# Patient Record
Sex: Female | Born: 1980 | Race: Black or African American | Hispanic: No | Marital: Single | Smoking: Never smoker
Health system: Southern US, Community
[De-identification: ages and names within clinical notes are randomized; demographics above are authoritative.]

## PROBLEM LIST (undated history)

## (undated) DIAGNOSIS — E079 Disorder of thyroid, unspecified: Secondary | ICD-10-CM

## (undated) DIAGNOSIS — E119 Type 2 diabetes mellitus without complications: Secondary | ICD-10-CM

## (undated) HISTORY — DX: Type 2 diabetes mellitus without complications: E11.9

## (undated) HISTORY — PX: TUBAL LIGATION: SHX77

---

## 2010-06-29 ENCOUNTER — Emergency Department (HOSPITAL_COMMUNITY): Admission: EM | Admit: 2010-06-29 | Discharge: 2010-06-29 | Payer: Self-pay | Admitting: Emergency Medicine

## 2011-02-26 LAB — POCT I-STAT, CHEM 8
BUN: 6 mg/dL (ref 6–23)
Calcium, Ion: 1.19 mmol/L (ref 1.12–1.32)
Creatinine, Ser: 0.4 mg/dL (ref 0.4–1.2)
Glucose, Bld: 123 mg/dL — ABNORMAL HIGH (ref 70–99)
HCT: 38 % (ref 36.0–46.0)
Hemoglobin: 12.9 g/dL (ref 12.0–15.0)
Sodium: 141 mEq/L (ref 135–145)

## 2011-02-26 LAB — POCT CARDIAC MARKERS
CKMB, poc: <1 ng/mL — ABNORMAL LOW (ref 1.0–8.0)
Myoglobin, poc: 23.6 ng/mL (ref 12–200)
Troponin i, poc: <0.05 ng/mL (ref 0.00–0.09)
Troponin i, poc: <0.05 ng/mL (ref 0.00–0.09)

## 2011-02-26 LAB — CBC
MCH: 26.4 pg (ref 26.0–34.0)
MCHC: 32.7 g/dL (ref 30.0–36.0)
MCV: 80.8 fL (ref 78.0–100.0)
Platelets: 202 10*3/uL (ref 150–400)

## 2011-02-26 LAB — TSH: TSH: <0.004 u[IU]/mL — ABNORMAL LOW (ref 0.350–4.500)

## 2011-02-26 LAB — DIFFERENTIAL
Basophils Relative: 1 % (ref 0–1)
Lymphs Abs: 2.8 10*3/uL (ref 0.7–4.0)
Monocytes Relative: 13 % — ABNORMAL HIGH (ref 3–12)
Neutro Abs: 2.7 10*3/uL (ref 1.7–7.7)

## 2011-09-03 ENCOUNTER — Emergency Department (INDEPENDENT_AMBULATORY_CARE_PROVIDER_SITE_OTHER): Payer: BC Managed Care – PPO

## 2011-09-03 ENCOUNTER — Other Ambulatory Visit: Payer: Self-pay

## 2011-09-03 ENCOUNTER — Emergency Department (HOSPITAL_BASED_OUTPATIENT_CLINIC_OR_DEPARTMENT_OTHER)
Admission: EM | Admit: 2011-09-03 | Discharge: 2011-09-03 | Disposition: A | Discharge status: Home / Self Care | Payer: BC Managed Care – PPO | Attending: Emergency Medicine | Admitting: Emergency Medicine

## 2011-09-03 ENCOUNTER — Encounter: Payer: Self-pay | Admitting: *Deleted

## 2011-09-03 DIAGNOSIS — R079 Chest pain, unspecified: Secondary | ICD-10-CM | POA: Insufficient documentation

## 2011-09-03 DIAGNOSIS — R0602 Shortness of breath: Secondary | ICD-10-CM | POA: Insufficient documentation

## 2011-09-03 DIAGNOSIS — R51 Headache: Secondary | ICD-10-CM

## 2011-09-03 LAB — CBC
HCT: 38.2 % (ref 36.0–46.0)
Hemoglobin: 12.6 g/dL (ref 12.0–15.0)
Platelets: 238 10*3/uL (ref 150–400)
RDW: 14.6 % (ref 11.5–15.5)

## 2011-09-03 LAB — BASIC METABOLIC PANEL
Calcium: 9.5 mg/dL (ref 8.4–10.5)
Chloride: 101 mEq/L (ref 96–112)
GFR calc Af Amer: >60 mL/min (ref >60)

## 2011-09-03 MED ORDER — ONDANSETRON HCL 4 MG/2ML IJ SOLN
4.0000 mg | Freq: Once | INTRAMUSCULAR | Status: AC
Start: 2011-09-03 — End: 2011-09-03
  Administered 2011-09-03: 4 mg via INTRAVENOUS
  Filled 2011-09-03: qty 2

## 2011-09-03 MED ORDER — ASPIRIN 325 MG PO TABS
325.0000 mg | ORAL_TABLET | Freq: Once | ORAL | Status: AC
  Administered 2011-09-03: 325 mg via ORAL
  Filled 2011-09-03: qty 1

## 2011-09-03 MED ORDER — NAPROXEN 500 MG PO TABS: 500.0000 mg | ORAL_TABLET | Freq: Two times a day (BID) | ORAL | Status: AC

## 2011-09-03 MED ORDER — HYDROCODONE-ACETAMINOPHEN 5-325 MG PO TABS: 1.0000 | ORAL_TABLET | ORAL | Status: AC | PRN

## 2011-09-03 MED ORDER — HYDROMORPHONE HCL 1 MG/ML IJ SOLN
1.0000 mg | Freq: Once | INTRAMUSCULAR | Status: AC
  Administered 2011-09-03: 1 mg via INTRAVENOUS
  Filled 2011-09-03: qty 1

## 2011-09-03 MED ORDER — SODIUM CHLORIDE 0.9 % IV SOLN: INTRAVENOUS | Status: DC

## 2011-09-03 MED ORDER — SODIUM CHLORIDE 0.9 % IV BOLUS (SEPSIS)
250.0000 mL | Freq: Once | INTRAVENOUS | Status: AC
  Administered 2011-09-03: 12:00:00 via INTRAVENOUS

## 2011-09-03 NOTE — ED Notes (Signed)
Headache x 1 week (no history ) taking Tylenol with no relief, chest 3 days ago, constant x 3 days, nothing makes it better, nothing makes it worse, increased family stress

## 2011-09-03 NOTE — ED Provider Notes (Signed)
History     CSN: [id number] Arrival date & time: 09/03/2011 10:57 AM  Chief Complaint  Patient presents with  . Chest Pain    HPI  (Consider location/radiation/quality/duration/timing/severity/associated sxs/prior treatment)  The history is provided by the patient.  C/O CP FOR 2 DAYS CONSTANT AND HEADACHE ALL OVER FOR ONE WEEK. ASSOCIATED WITH NAUSEA NO VOMITING OR SOB. SOME TINGLING IN BOTH HANDS EQUALLY.   History reviewed. No pertinent past medical history.  History reviewed. No pertinent past surgical history.  No family history on file.  History  Substance Use Topics  . Smoking status: Never Smoker   . Smokeless tobacco: Not on file  . Alcohol Use: No    OB History    Grav Para Term Preterm Abortions TAB SAB Ect Mult Living                  Review of Systems  Review of Systems  Constitutional: Negative for fever.  HENT: Negative for congestion and neck pain.   Eyes: Negative for redness.  Respiratory: Negative for cough and shortness of breath.   Cardiovascular: Negative for chest pain, palpitations and leg swelling.  Gastrointestinal: Negative for abdominal pain.  Genitourinary: Negative for dysuria.  Musculoskeletal: Negative for back pain.  Neurological: Positive for numbness and headaches. Negative for weakness.    Allergies  Review of patient's allergies indicates no known allergies.  Home Medications   Current Outpatient Rx  Name Route Sig Dispense Refill  . LEVOTHYROXINE SODIUM 75 MCG PO TABS Oral Take 75 mcg by mouth daily.        Physical Exam    BP 137/88  Pulse 66  Temp(Src) 98.6 F (37 C) (Oral)  Resp 19  SpO2 100%  Physical Exam  Nursing note and vitals reviewed. Constitutional: She is oriented to person, place, and time. She appears well-developed and well-nourished. No distress.  HENT:  Head: Normocephalic and atraumatic.  Mouth/Throat: Oropharynx is clear and moist.  Eyes: Conjunctivae and EOM are normal. Pupils are equal,  round, and reactive to light.  Neck: Normal range of motion. Neck supple.  Cardiovascular: Normal rate, regular rhythm and normal heart sounds.   Pulmonary/Chest: Effort normal and breath sounds normal.  Abdominal: Soft. Bowel sounds are normal.  Musculoskeletal: Normal range of motion. She exhibits no edema.  Neurological: She is alert and oriented to person, place, and time. No cranial nerve deficit. She exhibits normal muscle tone. Coordination normal.  Skin: Skin is warm and dry. No rash noted.    ED Course  Procedures (including critical care time)   Labs Reviewed  CBC  BASIC METABOLIC PANEL  TROPONIN I    Date: 09/03/2011  Rate: 68  Rhythm: normal sinus rhythm  QRS Axis: normal  Intervals: normal  ST/T Wave abnormalities: nonspecific T wave changes  Conduction Disutrbances:right bundle branch block  Narrative Interpretation:   Old EKG Reviewed: unchanged FROM 06/29/10 INCOMPLETE RBBB NOT COMPLETE.    Results for orders placed during the hospital encounter of 09/03/11  CBC      Component Value Range   WBC 5.9  4.0 - 10.5 (K/uL)   RBC 4.61  3.87 - 5.11 (MIL/uL)   Hemoglobin 12.6  12.0 - 15.0 (g/dL)   HCT 40.9  81.1 - 91.4 (%)   MCV 82.9  78.0 - 100.0 (fL)   MCH 27.3  26.0 - 34.0 (pg)   MCHC 33.0  30.0 - 36.0 (g/dL)   RDW 78.2  95.6 - 21.3 (%)   Platelets  238  150 - 400 (K/uL)  BASIC METABOLIC PANEL      Component Value Range   Sodium 137  135 - 145 (mEq/L)   Potassium 3.8  3.5 - 5.1 (mEq/L)   Chloride 101  96 - 112 (mEq/L)   CO2 26  19 - 32 (mEq/L)   Glucose, Bld 84  70 - 99 (mg/dL)   BUN 9  6 - 23 (mg/dL)   Creatinine, Ser 7.82  0.50 - 1.10 (mg/dL)   Calcium 9.5  8.4 - 95.6 (mg/dL)   GFR calc non Af Amer >60  >60 (mL/min)   GFR calc Af Amer >60  >60 (mL/min)  TROPONIN I      Component Value Range   Troponin I <0.30  <0.30 (ng/mL)   Results for orders placed during the hospital encounter of 09/03/11  CBC      Component Value Range   WBC 5.9  4.0 -  10.5 (K/uL)   RBC 4.61  3.87 - 5.11 (MIL/uL)   Hemoglobin 12.6  12.0 - 15.0 (g/dL)   HCT 21.3  08.6 - 57.8 (%)   MCV 82.9  78.0 - 100.0 (fL)   MCH 27.3  26.0 - 34.0 (pg)   MCHC 33.0  30.0 - 36.0 (g/dL)   RDW 46.9  62.9 - 52.8 (%)   Platelets 238  150 - 400 (K/uL)  BASIC METABOLIC PANEL      Component Value Range   Sodium 137  135 - 145 (mEq/L)   Potassium 3.8  3.5 - 5.1 (mEq/L)   Chloride 101  96 - 112 (mEq/L)   CO2 26  19 - 32 (mEq/L)   Glucose, Bld 84  70 - 99 (mg/dL)   BUN 9  6 - 23 (mg/dL)   Creatinine, Ser 4.13  0.50 - 1.10 (mg/dL)   Calcium 9.5  8.4 - 24.4 (mg/dL)   GFR calc non Af Amer >60  >60 (mL/min)   GFR calc Af Amer >60  >60 (mL/min)  TROPONIN I      Component Value Range   Troponin I <0.30  <0.30 (ng/mL)   Dg Chest 2 View  09/03/2011  *RADIOLOGY REPORT*  Clinical Data: Chest pain, shortness of breath  CHEST - 2 VIEW  Comparison: 06/29/2010  Findings: Lungs clear.  Heart size and pulmonary vascularity normal.  No effusion.  Visualized bones unremarkable.  IMPRESSION: No acute disease  Original Report Authenticated By: Osa Craver, M.D.   Ct Head Wo Contrast  09/03/2011  *RADIOLOGY REPORT*  Clinical Data: Headaches.  CT HEAD WITHOUT CONTRAST 09/03/2011:  Technique:  Contiguous axial images were obtained from the base of the skull through the vertex without contrast.  Comparison: None.  Findings: Ventricular system normal in size and appearance for age. No mass lesion.  No midline shift.  No acute hemorrhage or hematoma.  No extra-axial fluid collections.  No evidence of acute infarction.  Solitary punctate calcification in the corticomedullary junction of the right inferior parietal lobe (image 21).  No focal osseous abnormality involving the skull.  Visualized paranasal sinuses, mastoid air cells, and middle ear cavities well- aerated.  IMPRESSION: No acute or significant intracranial abnormality.  Original Report Authenticated By: Arnell Sieving, M.D.        MDM CT HEAD NEGATIVE IF SYM[PTOMS PERSIST WILL NEED MRI, CARDIAC MARKER NEGATIVE AND EKG WITHOUT ACUTE CHANGES NOT CW ACTUE CARDIAC EVENT SINCE PRESENT FOR 2-3 DAYS.   IMP: CHEST PAIN UNKN ETIOLOGY AND HEADACHE.  Shelda Jakes, MD 09/03/11 1420

## 2014-10-08 ENCOUNTER — Encounter (HOSPITAL_COMMUNITY): Payer: Self-pay | Admitting: Emergency Medicine

## 2014-10-08 DIAGNOSIS — H9209 Otalgia, unspecified ear: Secondary | ICD-10-CM | POA: Insufficient documentation

## 2014-10-08 DIAGNOSIS — R0981 Nasal congestion: Secondary | ICD-10-CM | POA: Insufficient documentation

## 2014-10-08 DIAGNOSIS — J209 Acute bronchitis, unspecified: Secondary | ICD-10-CM | POA: Diagnosis not present

## 2014-10-08 DIAGNOSIS — R52 Pain, unspecified: Secondary | ICD-10-CM | POA: Diagnosis present

## 2014-10-08 DIAGNOSIS — R079 Chest pain, unspecified: Secondary | ICD-10-CM | POA: Diagnosis not present

## 2014-10-08 DIAGNOSIS — J029 Acute pharyngitis, unspecified: Secondary | ICD-10-CM | POA: Diagnosis not present

## 2014-10-08 LAB — RAPID STREP SCREEN (MED CTR MEBANE ONLY): Streptococcus, Group A Screen (Direct): NEGATIVE

## 2014-10-08 NOTE — ED Notes (Signed)
Pt advised of the wait time

## 2014-10-08 NOTE — ED Notes (Signed)
The pt has multiple complaints since yesterday she has had total body aches headache earache sorethroat chest congestion face pain .  ??no temp  lmp  Oct 2nd

## 2014-10-09 ENCOUNTER — Emergency Department (HOSPITAL_COMMUNITY)
Admission: EM | Admit: 2014-10-09 | Discharge: 2014-10-09 | Disposition: A | Discharge status: Home / Self Care | Payer: 59 | Attending: Emergency Medicine | Admitting: Emergency Medicine

## 2014-10-09 ENCOUNTER — Emergency Department (HOSPITAL_COMMUNITY)
Admission: EM | Admit: 2014-10-09 | Discharge: 2014-10-09 | Discharge status: Against Medical Advice | Payer: 59 | Attending: Emergency Medicine | Admitting: Emergency Medicine

## 2014-10-09 ENCOUNTER — Emergency Department (HOSPITAL_COMMUNITY): Payer: 59

## 2014-10-09 ENCOUNTER — Encounter (HOSPITAL_COMMUNITY): Payer: Self-pay | Admitting: Emergency Medicine

## 2014-10-09 DIAGNOSIS — J40 Bronchitis, not specified as acute or chronic: Secondary | ICD-10-CM

## 2014-10-09 DIAGNOSIS — R05 Cough: Secondary | ICD-10-CM | POA: Diagnosis present

## 2014-10-09 DIAGNOSIS — R059 Cough, unspecified: Secondary | ICD-10-CM

## 2014-10-09 DIAGNOSIS — Z79899 Other long term (current) drug therapy: Secondary | ICD-10-CM | POA: Insufficient documentation

## 2014-10-09 DIAGNOSIS — J209 Acute bronchitis, unspecified: Secondary | ICD-10-CM | POA: Insufficient documentation

## 2014-10-09 MED ORDER — AZITHROMYCIN 250 MG PO TABS
250.0000 mg | ORAL_TABLET | Freq: Every day | ORAL | Status: DC
Start: 2014-10-09 — End: 2016-01-15

## 2014-10-09 MED ORDER — ALBUTEROL SULFATE HFA 108 (90 BASE) MCG/ACT IN AERS
2.0000 | INHALATION_SPRAY | RESPIRATORY_TRACT | Status: DC
  Administered 2014-10-09: 2 via RESPIRATORY_TRACT
  Filled 2014-10-09: qty 6.7

## 2014-10-09 NOTE — ED Provider Notes (Signed)
CSN: [id number]     Arrival date & time 10/09/14  1715 History   First MD Initiated Contact with Patient 10/09/14 1928     Chief Complaint  Patient presents with  . URI     (Consider location/radiation/quality/duration/timing/severity/associated sxs/prior Treatment) The history is provided by the patient.   patient was cough and congestion as well as myalgias.  She's had upper respiratory symptoms.  She is now having productive cough and some very mild shortness of breath.  She's had fever at home.  No documentation of this fever.  Denies diarrhea.  No nausea or vomiting.  No recent sick contacts.  Symptoms are mild to moderate in severity  History reviewed. No pertinent past medical history. Past Surgical History  Procedure Laterality Date  . Tubal ligation     History reviewed. No pertinent family history. History  Substance Use Topics  . Smoking status: Never Smoker   . Smokeless tobacco: Not on file  . Alcohol Use: No   OB History   Grav Para Term Preterm Abortions TAB SAB Ect Mult Living                 Review of Systems  All other systems reviewed and are negative.     Allergies  Review of patient's allergies indicates no known allergies.  Home Medications   Prior to Admission medications   Medication Sig Start Date End Date Taking? Authorizing Provider  levothyroxine (SYNTHROID, LEVOTHROID) 112 MCG tablet Take 112 mcg by mouth daily before breakfast.   Yes Historical Provider, MD  Phenyleph-Doxylamine-DM-APAP (ALKA SELTZER PLUS PO) Take 1 capsule by mouth daily as needed (congestion).   Yes Historical Provider, MD  azithromycin (ZITHROMAX Z-PAK) 250 MG tablet Take 1 tablet (250 mg total) by mouth daily. Take 2 tabs for first dose, then 1 tab for each additional dose 10/09/14   Lyanne CoKevin M Daisja Kessinger, MD   BP 113/71  Pulse 108  Temp(Src) 98.6 F (37 C) (Oral)  Resp 20  Ht 5' (1.524 m)  Wt 205 lb (92.987 kg)  BMI 40.04 kg/m2  SpO2 100%  LMP 09/12/2014 Physical  Exam  Nursing note and vitals reviewed. Constitutional: She is oriented to person, place, and time. She appears well-developed and well-nourished. No distress.  HENT:  Head: Normocephalic and atraumatic.  Eyes: EOM are normal.  Neck: Normal range of motion.  Cardiovascular: Normal rate, regular rhythm and normal heart sounds.   Pulmonary/Chest: Effort normal and breath sounds normal.  Abdominal: Soft. She exhibits no distension. There is no tenderness.  Musculoskeletal: Normal range of motion.  Neurological: She is alert and oriented to person, place, and time.  Skin: Skin is warm and dry.  Psychiatric: She has a normal mood and affect. Judgment normal.    ED Course  Procedures (including critical care time) Labs Review Labs Reviewed - No data to display  Imaging Review Dg Chest 2 View  10/09/2014   CLINICAL DATA:  Cough, congestion, body aches  EXAM: CHEST  2 VIEW  COMPARISON:  09/03/2011  FINDINGS: Normal cardiac silhouette. There is mild linear markings at the lung bases similar to prior. No effusion, infiltrate, or pneumothorax. No acute osseous abnormality.  IMPRESSION: Linear markings at the lung bases could represent bronchitis. No focal infiltrate.   Electronically Signed   By: Genevive BiStewart  Edmunds M.D.   On: 10/09/2014 18:35     EKG Interpretation None      MDM   Final diagnoses:  Bronchitis    Likely bronchitis.  Will cover for early developing pneumonia given productive cough and subjective fever.    Lyanne CoKevin M Khushboo Chuck, MD 10/09/14 1946

## 2014-10-09 NOTE — ED Notes (Signed)
Pt told registration she was leaving.  

## 2014-10-09 NOTE — ED Notes (Addendum)
Pt in c/o cough, congestion, body aches, and bilateral ear ache since yesterday, chest pain with coughing, no distress noted, also reports sore throat, unsure of fever at home but states she has had chills

## 2014-10-11 LAB — CULTURE, GROUP A STREP

## 2016-01-14 ENCOUNTER — Encounter (HOSPITAL_COMMUNITY): Payer: Self-pay | Admitting: Emergency Medicine

## 2016-01-14 ENCOUNTER — Emergency Department (HOSPITAL_COMMUNITY)
Admission: EM | Admit: 2016-01-14 | Discharge: 2016-01-15 | Disposition: A | Discharge status: Home / Self Care | Payer: 59 | Attending: Emergency Medicine | Admitting: Emergency Medicine

## 2016-01-14 DIAGNOSIS — R51 Headache: Secondary | ICD-10-CM | POA: Diagnosis present

## 2016-01-14 DIAGNOSIS — Z79899 Other long term (current) drug therapy: Secondary | ICD-10-CM | POA: Diagnosis not present

## 2016-01-14 DIAGNOSIS — E039 Hypothyroidism, unspecified: Secondary | ICD-10-CM | POA: Diagnosis not present

## 2016-01-14 DIAGNOSIS — R519 Headache, unspecified: Secondary | ICD-10-CM

## 2016-01-14 DIAGNOSIS — Z792 Long term (current) use of antibiotics: Secondary | ICD-10-CM | POA: Diagnosis not present

## 2016-01-14 DIAGNOSIS — R110 Nausea: Secondary | ICD-10-CM | POA: Insufficient documentation

## 2016-01-14 HISTORY — DX: Disorder of thyroid, unspecified: E07.9

## 2016-01-14 MED ORDER — METOCLOPRAMIDE HCL 10 MG PO TABS
10.0000 mg | ORAL_TABLET | Freq: Once | ORAL | Status: AC
  Administered 2016-01-15: 10 mg via ORAL
  Filled 2016-01-14: qty 1

## 2016-01-14 MED ORDER — DIPHENHYDRAMINE HCL 25 MG PO CAPS
50.0000 mg | ORAL_CAPSULE | Freq: Once | ORAL | Status: AC
  Administered 2016-01-15: 50 mg via ORAL
  Filled 2016-01-14: qty 2

## 2016-01-14 MED ORDER — IBUPROFEN 800 MG PO TABS
800.0000 mg | ORAL_TABLET | Freq: Once | ORAL | Status: AC
  Administered 2016-01-15: 800 mg via ORAL
  Filled 2016-01-14: qty 1

## 2016-01-14 NOTE — ED Notes (Signed)
Dr. Oni at the bedside.  

## 2016-01-14 NOTE — ED Provider Notes (Signed)
CSN: [id number]     Arrival date & time 01/14/16  2340 History  By signing my name below, I, Lyndel Safe, attest that this documentation has been prepared under the direction and in the presence of Tomasita Crumble, MD. Electronically Signed: Lyndel Safe, ED Scribe. 01/14/2016. 12:00 AM.   Chief Complaint  Patient presents with  . Headache   The history is provided by the patient. No language interpreter was used.   HPI Comments: Jaziyah Gradel is a 35 y.o. female, with a h/o hypothyroid disease, who presents to the Emergency Department complaining of an intermittent, throbbing, right-sided frontal headache onset 3 days ago that briefly resolved but then reappeared. Pt associates nausea. She has taken tylenol without significant relief. The pt notes no history of similar headaches. She denies difficulty ambulating, numbness or weakness in extremities, bowel or bladder incontinence or difficulty urinating, chest pain, SOB, visual disturbances, or recent illness.   Past Medical History  Diagnosis Date  . Thyroid disease     hpyothyroid   Past Surgical History  Procedure Laterality Date  . Tubal ligation     History reviewed. No pertinent family history. Social History  Substance Use Topics  . Smoking status: Never Smoker   . Smokeless tobacco: None  . Alcohol Use: No   OB History    No data available     Review of Systems  Constitutional: Negative for fever.  Eyes: Negative for photophobia and visual disturbance.  Respiratory: Negative for cough and shortness of breath.   Cardiovascular: Negative for chest pain.  Gastrointestinal: Positive for nausea. Negative for vomiting.  Genitourinary: Negative for difficulty urinating.  Musculoskeletal: Negative for gait problem.  Neurological: Positive for headaches. Negative for weakness and numbness.    A complete 10 system review of systems was obtained and is otherwise negative except at noted in the HPI and PMH.  Allergies   Review of patient's allergies indicates no known allergies.  Home Medications   Prior to Admission medications   Medication Sig Start Date End Date Taking? Authorizing Provider  azithromycin (ZITHROMAX Z-PAK) 250 MG tablet Take 1 tablet (250 mg total) by mouth daily. Take 2 tabs for first dose, then 1 tab for each additional dose 10/09/14   Azalia Bilis, MD  levothyroxine (SYNTHROID, LEVOTHROID) 112 MCG tablet Take 112 mcg by mouth daily before breakfast.    Historical Provider, MD  Phenyleph-Doxylamine-DM-APAP (ALKA SELTZER PLUS PO) Take 1 capsule by mouth daily as needed (congestion).    Historical Provider, MD   BP 130/78 mmHg  Pulse 70  Temp(Src) 98 F (36.7 C) (Oral)  Resp 16  Ht 5' (1.524 m)  Wt 212 lb (96.163 kg)  BMI 41.40 kg/m2  SpO2 100%  LMP 12/31/2015 (Exact Date) Physical Exam  Constitutional: She is oriented to person, place, and time. She appears well-developed and well-nourished. No distress.  HENT:  Head: Normocephalic and atraumatic.  Nose: Nose normal.  Mouth/Throat: Oropharynx is clear and moist. No oropharyngeal exudate.  Eyes: Conjunctivae and EOM are normal. Pupils are equal, round, and reactive to light. No scleral icterus.  Neck: Normal range of motion. Neck supple. No JVD present. No tracheal deviation present. No thyromegaly present.  Cardiovascular: Normal rate, regular rhythm and normal heart sounds.  Exam reveals no gallop and no friction rub.   No murmur heard. Pulmonary/Chest: Effort normal and breath sounds normal. No respiratory distress. She has no wheezes. She exhibits no tenderness.  Abdominal: Soft. Bowel sounds are normal. She exhibits no distension and  no mass. There is no tenderness. There is no rebound and no guarding.  Musculoskeletal: Normal range of motion. She exhibits no edema or tenderness.  Lymphadenopathy:    She has no cervical adenopathy.  Neurological: She is alert and oriented to person, place, and time. No cranial nerve  deficit. She exhibits normal muscle tone.  Normal strength and sensation in all extremities. Normal cerebellar testing. Normal gait.  Skin: Skin is warm and dry. No rash noted. No erythema. No pallor.  Nursing note and vitals reviewed.   ED Course  Procedures  DIAGNOSTIC STUDIES: Oxygen Saturation is 100% on RA, normal by my interpretation.    COORDINATION OF CARE: 11:53 PM Discussed treatment plan with pt. Migraine cocktail ordered. Pt acknowledges and agrees to plan.    MDM   Final diagnoses:  None   Patient presents to the emergency department for headache. She denies other neurological symptoms. I do not believe the patient has emergent intracranial abnormality. Neurological exam is completely normal. We'll treat as headache, patient given Benadryl, Motrin, Reglan for treatment. Her headache was improved but not gone. She is requesting more medication. She was redosed with Compazine, Solu-Medrol, magnesium. Upon repeat evaluation, patient states her headache has resolved. We'll discharge with Compazine to take as needed. She appears well in no acute distress, vital signs were within her normal limits and she is safe for discharge.    I personally performed the services described in this documentation, which was scribed in my presence. The recorded information has been reviewed and is accurate.      Tomasita Crumble, MD 01/15/16 [phone]

## 2016-01-14 NOTE — ED Notes (Signed)
Headache since Monday with nv .  Hx of the same  lmp 2 weeks ago

## 2016-01-14 NOTE — ED Notes (Signed)
Patient reports taking 1g of tylenol around 10pm for headache with no relief.

## 2016-01-15 MED ORDER — PROCHLORPERAZINE EDISYLATE 5 MG/ML IJ SOLN
10.0000 mg | Freq: Once | INTRAMUSCULAR | Status: AC
  Administered 2016-01-15: 10 mg via INTRAVENOUS
  Filled 2016-01-15: qty 2

## 2016-01-15 MED ORDER — METHYLPREDNISOLONE SODIUM SUCC 125 MG IJ SOLR
125.0000 mg | Freq: Once | INTRAMUSCULAR | Status: AC
  Administered 2016-01-15: 125 mg via INTRAVENOUS
  Filled 2016-01-15: qty 2

## 2016-01-15 MED ORDER — PROCHLORPERAZINE MALEATE 10 MG PO TABS: 10.0000 mg | ORAL_TABLET | Freq: Two times a day (BID) | ORAL | Status: AC | PRN

## 2016-01-15 MED ORDER — SODIUM CHLORIDE 0.9 % IV BOLUS (SEPSIS)
1000.0000 mL | Freq: Once | INTRAVENOUS | Status: AC
  Administered 2016-01-15: 1000 mL via INTRAVENOUS

## 2016-01-15 MED ORDER — MAGNESIUM SULFATE 2 GM/50ML IV SOLN
2.0000 g | Freq: Once | INTRAVENOUS | Status: AC
  Administered 2016-01-15: 2 g via INTRAVENOUS
  Filled 2016-01-15: qty 50

## 2016-01-15 NOTE — ED Notes (Signed)
Reported unrelieved headache to dr. Mora Bellman, md acknowledges, awaiting orders.

## 2016-01-15 NOTE — Discharge Instructions (Signed)
General Headache Without Cause Yolanda Graves, take tylenol or ibuprofen as needed for your headache.  If it becomes severe, take compazine.  See a primary care doctor within 3 days.  If symptoms worsen, come back to the ED immediately.  Thank you. A headache is pain or discomfort felt around the head or neck area. There are many causes and types of headaches. In some cases, the cause may not be found.  HOME CARE  Managing Pain  Take over-the-counter and prescription medicines only as told by your doctor.  Lie down in a dark, quiet room when you have a headache.  If directed, apply ice to the head and neck area:  Put ice in a plastic bag.  Place a towel between your skin and the bag.  Leave the ice on for 20 minutes, 2-3 times per day.  Use a heating pad or hot shower to apply heat to the head and neck area as told by your doctor.  Keep lights dim if bright lights bother you or make your headaches worse. Eating and Drinking  Eat meals on a regular schedule.  Lessen how much alcohol you drink.  Lessen how much caffeine you drink, or stop drinking caffeine. General Instructions  Keep all follow-up visits as told by your doctor. This is important.  Keep a journal to find out if certain things bring on headaches. For example, write down:  What you eat and drink.  How much sleep you get.  Any change to your diet or medicines.  Relax by getting a massage or doing other relaxing activities.  Lessen stress.  Sit up straight. Do not tighten (tense) your muscles.  Do not use tobacco products. This includes cigarettes, chewing tobacco, or e-cigarettes. If you need help quitting, ask your doctor.  Exercise regularly as told by your doctor.  Get enough sleep. This often means 7-9 hours of sleep. GET HELP IF:  Your symptoms are not helped by medicine.  You have a headache that feels different than the other headaches.  You feel sick to your stomach (nauseous) or you throw up  (vomit).  You have a fever. GET HELP RIGHT AWAY IF:   Your headache becomes really bad.  You keep throwing up.  You have a stiff neck.  You have trouble seeing.  You have trouble speaking.  You have pain in the eye or ear.  Your muscles are weak or you lose muscle control.  You lose your balance or have trouble walking.  You feel like you will pass out (faint) or you pass out.  You have confusion.   This information is not intended to replace advice given to you by your health care provider. Make sure you discuss any questions you have with your health care provider.   Document Released: 09/06/2008 Document Revised: 08/19/2015 Document Reviewed: 03/23/2015 Elsevier Interactive Patient Education [employer]

## 2018-04-05 ENCOUNTER — Encounter (HOSPITAL_COMMUNITY): Payer: Self-pay

## 2018-04-05 ENCOUNTER — Other Ambulatory Visit: Payer: Self-pay

## 2018-04-05 ENCOUNTER — Emergency Department (HOSPITAL_COMMUNITY): Payer: 59

## 2018-04-05 ENCOUNTER — Observation Stay (HOSPITAL_COMMUNITY)
Admission: EM | Admit: 2018-04-05 | Discharge: 2018-04-08 | DRG: 872 | Disposition: A | Discharge status: Home / Self Care | Payer: 59 | Attending: Internal Medicine | Admitting: Internal Medicine

## 2018-04-05 DIAGNOSIS — R739 Hyperglycemia, unspecified: Secondary | ICD-10-CM | POA: Diagnosis present

## 2018-04-05 DIAGNOSIS — A4189 Other specified sepsis: Secondary | ICD-10-CM | POA: Diagnosis not present

## 2018-04-05 DIAGNOSIS — R6510 Systemic inflammatory response syndrome (SIRS) of non-infectious origin without acute organ dysfunction: Secondary | ICD-10-CM | POA: Diagnosis present

## 2018-04-05 DIAGNOSIS — R402413 Glasgow coma scale score 13-15, at hospital admission: Secondary | ICD-10-CM | POA: Diagnosis not present

## 2018-04-05 DIAGNOSIS — E860 Dehydration: Secondary | ICD-10-CM | POA: Diagnosis not present

## 2018-04-05 DIAGNOSIS — R079 Chest pain, unspecified: Secondary | ICD-10-CM | POA: Diagnosis present

## 2018-04-05 DIAGNOSIS — E039 Hypothyroidism, unspecified: Secondary | ICD-10-CM | POA: Diagnosis not present

## 2018-04-05 DIAGNOSIS — D72829 Elevated white blood cell count, unspecified: Secondary | ICD-10-CM | POA: Diagnosis present

## 2018-04-05 DIAGNOSIS — E669 Obesity, unspecified: Secondary | ICD-10-CM | POA: Diagnosis present

## 2018-04-05 DIAGNOSIS — Z923 Personal history of irradiation: Secondary | ICD-10-CM

## 2018-04-05 DIAGNOSIS — Z6841 Body Mass Index (BMI) 40.0 and over, adult: Secondary | ICD-10-CM | POA: Diagnosis not present

## 2018-04-05 DIAGNOSIS — B349 Viral infection, unspecified: Secondary | ICD-10-CM | POA: Diagnosis not present

## 2018-04-05 DIAGNOSIS — R000 Tachycardia, unspecified: Secondary | ICD-10-CM | POA: Diagnosis present

## 2018-04-05 DIAGNOSIS — A419 Sepsis, unspecified organism: Secondary | ICD-10-CM | POA: Diagnosis present

## 2018-04-05 DIAGNOSIS — R509 Fever, unspecified: Secondary | ICD-10-CM | POA: Diagnosis present

## 2018-04-05 DIAGNOSIS — Z79899 Other long term (current) drug therapy: Secondary | ICD-10-CM

## 2018-04-05 LAB — BASIC METABOLIC PANEL
ANION GAP: 10 (ref 5–15)
BUN: 7 mg/dL (ref 6–20)
CO2: 25 mmol/L (ref 22–32)
Calcium: 9.6 mg/dL (ref 8.9–10.3)
Chloride: 101 mmol/L (ref 101–111)
Creatinine, Ser: 0.86 mg/dL (ref 0.44–1.00)
GFR calc non Af Amer: >60 mL/min (ref >60)
Glucose, Bld: 137 mg/dL — ABNORMAL HIGH (ref 65–99)
Potassium: 4.1 mmol/L (ref 3.5–5.1)
Sodium: 136 mmol/L (ref 135–145)

## 2018-04-05 LAB — CBC
HCT: 39.7 % (ref 36.0–46.0)
HEMOGLOBIN: 12.7 g/dL (ref 12.0–15.0)
MCH: 26.5 pg (ref 26.0–34.0)
MCHC: 32 g/dL (ref 30.0–36.0)
MCV: 82.9 fL (ref 78.0–100.0)
Platelets: 297 10*3/uL (ref 150–400)
RBC: 4.79 MIL/uL (ref 3.87–5.11)
RDW: 15.9 % — ABNORMAL HIGH (ref 11.5–15.5)
WBC: 14.2 10*3/uL — ABNORMAL HIGH (ref 4.0–10.5)

## 2018-04-05 LAB — URINALYSIS, ROUTINE W REFLEX MICROSCOPIC
Bilirubin Urine: NEGATIVE
GLUCOSE, UA: NEGATIVE mg/dL
Hgb urine dipstick: NEGATIVE
Ketones, ur: NEGATIVE mg/dL
Leukocytes, UA: NEGATIVE
Nitrite: NEGATIVE
PROTEIN: NEGATIVE mg/dL
Specific Gravity, Urine: 1.021 (ref 1.005–1.030)
pH: 7 (ref 5.0–8.0)

## 2018-04-05 LAB — HEPATIC FUNCTION PANEL
ALT: 20 U/L (ref 14–54)
AST: 25 U/L (ref 15–41)
Albumin: 4 g/dL (ref 3.5–5.0)
Alkaline Phosphatase: 57 U/L (ref 38–126)
Total Bilirubin: 0.6 mg/dL (ref 0.3–1.2)
Total Protein: 8.1 g/dL (ref 6.5–8.1)

## 2018-04-05 LAB — I-STAT BETA HCG BLOOD, ED (MC, WL, AP ONLY): I-stat hCG, quantitative: <5 m[IU]/mL (ref <5)

## 2018-04-05 LAB — I-STAT CG4 LACTIC ACID, ED
LACTIC ACID, VENOUS: 2.14 mmol/L — AB (ref 0.5–1.9)
LACTIC ACID, VENOUS: 3.85 mmol/L — AB (ref 0.5–1.9)
LACTIC ACID, VENOUS: 2.84 mmol/L — AB (ref 0.5–1.9)

## 2018-04-05 LAB — SEDIMENTATION RATE: Sed Rate: 17 mm/hr (ref 0–22)

## 2018-04-05 LAB — T4, FREE: Free T4: 1.31 ng/dL — ABNORMAL HIGH (ref 0.61–1.12)

## 2018-04-05 LAB — INFLUENZA PANEL BY PCR (TYPE A & B)
INFLBPCR: NEGATIVE
Influenza A By PCR: NEGATIVE

## 2018-04-05 LAB — I-STAT TROPONIN, ED: Troponin i, poc: 0 ng/mL (ref 0.00–0.08)

## 2018-04-05 LAB — TSH: TSH: 0.447 u[IU]/mL (ref 0.350–4.500)

## 2018-04-05 MED ORDER — LACTATED RINGERS IV BOLUS
1000.0000 mL | Freq: Once | INTRAVENOUS | Status: DC
  Administered 2018-04-05: 1000 mL via INTRAVENOUS

## 2018-04-05 MED ORDER — SODIUM CHLORIDE 0.9 % IV SOLN
1.0000 g | Freq: Once | INTRAVENOUS | Status: AC
  Administered 2018-04-05: 1 g via INTRAVENOUS
  Filled 2018-04-05: qty 10

## 2018-04-05 MED ORDER — VANCOMYCIN HCL 10 G IV SOLR
2500.0000 mg | Freq: Once | INTRAVENOUS | Status: AC
  Administered 2018-04-05: 2500 mg via INTRAVENOUS
  Filled 2018-04-05 (×2): qty 2500

## 2018-04-05 MED ORDER — IOPAMIDOL (ISOVUE-300) INJECTION 61%
INTRAVENOUS | Status: AC
  Filled 2018-04-05: qty 30

## 2018-04-05 MED ORDER — ACETAMINOPHEN 325 MG PO TABS
650.0000 mg | ORAL_TABLET | Freq: Once | ORAL | Status: AC | PRN
  Administered 2018-04-05: 650 mg via ORAL
  Filled 2018-04-05: qty 2

## 2018-04-05 MED ORDER — IOPAMIDOL (ISOVUE-370) INJECTION 76%
INTRAVENOUS | Status: AC
  Filled 2018-04-05: qty 100

## 2018-04-05 MED ORDER — IOPAMIDOL (ISOVUE-370) INJECTION 76%
100.0000 mL | Freq: Once | INTRAVENOUS | Status: AC | PRN
  Administered 2018-04-05: 100 mL via INTRAVENOUS

## 2018-04-05 MED ORDER — ACETAMINOPHEN 325 MG PO TABS
325.0000 mg | ORAL_TABLET | Freq: Once | ORAL | Status: AC
  Administered 2018-04-05: 325 mg via ORAL
  Filled 2018-04-05: qty 1

## 2018-04-05 MED ORDER — DIPHENHYDRAMINE HCL 50 MG/ML IJ SOLN
25.0000 mg | Freq: Once | INTRAMUSCULAR | Status: AC
  Administered 2018-04-05: 25 mg via INTRAVENOUS
  Filled 2018-04-05: qty 1

## 2018-04-05 MED ORDER — IBUPROFEN 400 MG PO TABS
600.0000 mg | ORAL_TABLET | Freq: Once | ORAL | Status: AC
  Administered 2018-04-05: 600 mg via ORAL
  Filled 2018-04-05: qty 1

## 2018-04-05 MED ORDER — SODIUM CHLORIDE 0.9 % IV BOLUS
1000.0000 mL | Freq: Once | INTRAVENOUS | Status: AC
Start: 2018-04-05 — End: 2018-04-05
  Administered 2018-04-05: 1000 mL via INTRAVENOUS

## 2018-04-05 MED ORDER — VANCOMYCIN HCL IN DEXTROSE 1-5 GM/200ML-% IV SOLN
1000.0000 mg | Freq: Three times a day (TID) | INTRAVENOUS | Status: DC
  Administered 2018-04-06 (×2): 1000 mg via INTRAVENOUS
  Filled 2018-04-05 (×3): qty 200

## 2018-04-05 MED ORDER — SODIUM CHLORIDE 0.9 % IV BOLUS
2000.0000 mL | Freq: Once | INTRAVENOUS | Status: AC
  Administered 2018-04-05: 2000 mL via INTRAVENOUS

## 2018-04-05 NOTE — ED Notes (Signed)
Patient transported to CT 

## 2018-04-05 NOTE — ED Provider Notes (Signed)
[facility]   CSN: [id number] Arrival date & time: 04/05/18  1352     History   Chief Complaint Chief Complaint  Patient presents with  . Chest Pain    HPI Gearldine Shownequilla Harbaugh is a 37 y.o. female.   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  Patient felt symptoms for the last several days.  Feels febrile.  Denies any cough, urinary symptoms, abdominal pain.  No nausea, diarrhea, vomiting.  Denies IV drug use, perirectal pain, recent travel, high risk exposures.   The history is provided by the patient.   Illness   This is a new problem.  The current episode started more than 2 days ago. The problem occurs constantly. The problem has been gradually worsening. Associated symptoms include chest pain. Pertinent negatives include no abdominal pain, no headaches and no shortness of breath. Associated symptoms comments: Body aches, myalgias .  Nothing aggravates the symptoms.  Nothing relieves the symptoms.  She has tried nothing for the symptoms. The treatment provided no relief.    Past Medical History:  Diagnosis Date  . Thyroid disease    hpyothyroid    There are no active problems to display for this patient.   Past Surgical History:  Procedure Laterality Date  . TUBAL LIGATION       OB History   None      Home Medications    Prior to Admission medications   Medication Sig Start Date End Date Taking? Authorizing Provider  acetaminophen (TYLENOL) 325 MG tablet Take 650 mg by mouth every 6 (six) hours as needed for headache.    [provider]  levothyroxine (SYNTHROID, LEVOTHROID) 112 MCG tablet Take 112 mcg by mouth daily before breakfast.    [provider]  Phenyleph-Doxylamine-DM-APAP (ALKA SELTZER PLUS PO) Take 1 capsule by mouth daily as needed (congestion).    [provider]  prochlorperazine (COMPAZINE) 10 MG tablet Take 1 tablet (10 mg total) by mouth 2 (two) times daily as needed for vomiting (headache). 01/15/16   Tomasita Crumbleni, Adeleke, MD    Family History No family history on file.  Social History Social History   Tobacco Use  . Smoking status: Never Smoker  . Smokeless tobacco: Never Used  Substance Use Topics  . Alcohol use: No  . Drug use: No     Allergies   Patient has no known allergies.   Review of Systems Review of Systems  Constitutional: Positive for chills and fever.  HENT: Negative for congestion, drooling, ear discharge, ear pain, sinus pressure, sneezing, sore throat, trouble swallowing and voice change.   Eyes: Negative for pain and visual disturbance.  Respiratory: Negative for cough and shortness of breath.   Cardiovascular: Positive for chest pain. Negative for palpitations and leg swelling.  Gastrointestinal: Negative for abdominal distention, abdominal pain, blood in stool, diarrhea, nausea, rectal pain and vomiting.  Genitourinary: Negative for decreased urine volume, difficulty urinating, dyspareunia, dysuria, hematuria, pelvic pain, urgency, vaginal bleeding and vaginal discharge.  Musculoskeletal: Positive for myalgias. Negative for arthralgias, back pain, gait problem, joint swelling and neck pain.  Skin: Negative for color change and rash.  Neurological: Negative for dizziness, seizures, syncope, speech difficulty, weakness, numbness and headaches.  All other systems reviewed and are negative.    Physical Exam Updated Vital Signs  ED Triage Vitals  Enc Vitals Group  BP 04/05/18 1401 (!) 144/92     Pulse Rate 04/05/18 1401 (!) 123     Resp --      Temp 04/05/18 1423 (!) 103 F (39.4 C)     Temp Source 04/05/18 1423 Oral     SpO2 04/05/18 1401 97 %     Weight 04/05/18 1400 236 lb (107 kg)     Height 04/05/18 1400 5' (1.524 m)     Head Circumference --      Peak Flow --      Pain Score 04/05/18  1359 9     Pain Loc --      Pain Edu? --      Excl. in GC? --     Physical Exam  Constitutional: She is oriented to person, place, and time. She appears well-developed. She appears ill. She appears distressed.  HENT:  Head: Normocephalic and atraumatic.  Eyes: Pupils are equal, round, and reactive to light. Conjunctivae and EOM are normal.  Neck: Normal range of motion. Neck supple.  Negative meningeal signs  Cardiovascular: Regular rhythm, intact distal pulses and normal pulses. Tachycardia present.  No murmur heard. Pulmonary/Chest: Effort normal and breath sounds normal. No respiratory distress. She has no decreased breath sounds.  Abdominal: Soft. She exhibits no distension. There is no tenderness. There is no guarding.  Musculoskeletal: Normal range of motion. She exhibits no edema.       Right lower leg: Normal. She exhibits no edema.       Left lower leg: Normal. She exhibits no edema.  Lymphadenopathy:    She has no cervical adenopathy.  Neurological: She is alert and oriented to person, place, and time. She is not disoriented. No cranial nerve deficit.  Skin: Skin is warm and dry. Capillary refill takes less than 2 seconds.  Psychiatric: She has a normal mood and affect.  Nursing note and vitals reviewed.    ED Treatments / Results  Labs (all labs ordered are listed, but only abnormal results are displayed) Labs Reviewed  BASIC METABOLIC PANEL - Abnormal; Notable for the following components:      Result Value   Glucose, Bld 137 (*)    All other components within normal limits  CBC - Abnormal; Notable for the following components:   WBC 14.2 (*)    RDW 15.9 (*)    All other components within normal limits  T4, FREE - Abnormal; Notable for the following components:   Free T4 1.31 (*)    All other components within normal limits  HEPATIC FUNCTION PANEL - Abnormal; Notable for the following components:   Bilirubin, Direct <0.1 (*)    All other components within  normal limits  I-STAT CG4 LACTIC ACID, ED - Abnormal; Notable for the following components:   Lactic Acid, Venous 2.14 (*)    All other components within normal limits  I-STAT CG4 LACTIC ACID, ED - Abnormal; Notable for the following components:   Lactic Acid, Venous 3.85 (*)    All other components within normal limits  I-STAT CG4 LACTIC ACID, ED - Abnormal; Notable for the following components:   Lactic Acid, Venous 2.84 (*)    All other components within normal limits  URINE CULTURE  CULTURE, BLOOD (ROUTINE X 2)  CULTURE, BLOOD (ROUTINE X 2)  TSH  URINALYSIS, ROUTINE W REFLEX MICROSCOPIC  INFLUENZA PANEL BY PCR (TYPE A & B)  T3  I-STAT TROPONIN, ED  I-STAT BETA HCG BLOOD, ED ([facility], [facility], [facility] ONLY)  EKG None  Radiology Dg Chest 2 View  Result Date: 04/05/2018 CLINICAL DATA:  37 y/o F; fever, chest pain, shortness of breath, and numbness in the legs for 3 days. EXAM: CHEST - 2 VIEW COMPARISON:  10/09/2014 chest radiograph FINDINGS: Stable heart size and mediastinal contours are within normal limits. Both lungs are clear. The visualized skeletal structures are unremarkable. IMPRESSION: No active cardiopulmonary disease. Electronically Signed   By: Mitzi Hansen M.D.   On: 04/05/2018 15:11   Ct Angio Chest Pe W And/or Wo Contrast  Result Date: 04/05/2018 CLINICAL DATA:  Intermittent chest pain over the last 6 days. EXAM: CT ANGIOGRAPHY CHEST WITH CONTRAST TECHNIQUE: Multidetector CT imaging of the chest was performed using the standard protocol during bolus administration of intravenous contrast. Multiplanar CT image reconstructions and MIPs were obtained to evaluate the vascular anatomy. CONTRAST:  ISOVUE-370 IOPAMIDOL (ISOVUE-370) INJECTION 76% COMPARISON:  Radiography same day FINDINGS: Cardiovascular: Pulmonary arterial opacification is moderate. No pulmonary emboli are seen. No aortic atherosclerosis. Heart size is normal. No coronary artery calcification. No  pericardial fluid. Mediastinum/Nodes: Normal Lungs/Pleura: Normal Upper Abdomen: Normal Musculoskeletal: Normal Review of the MIP images confirms the above findings. IMPRESSION: Normal examination. No pulmonary emboli. No other cause of intermittent chest pain identified. Electronically Signed   By: Paulina Fusi M.D.   On: 04/05/2018 19:04    Procedures Procedures (including critical care time)  Medications Ordered in ED Medications  vancomycin (VANCOCIN) IVPB 1000 mg/200 mL premix (has no administration in time range)  iopamidol (ISOVUE-370) 76 % injection (has no administration in time range)  acetaminophen (TYLENOL) tablet 650 mg (650 mg Oral Given 04/05/18 1409)  acetaminophen (TYLENOL) tablet 325 mg (325 mg Oral Given 04/05/18 1623)  cefTRIAXone (ROCEPHIN) 1 g in sodium chloride 0.9 % 100 mL IVPB (0 g Intravenous Stopped 04/05/18 1810)  sodium chloride 0.9 % bolus 1,000 mL (0 mLs Intravenous Stopped 04/05/18 1726)  sodium chloride 0.9 % bolus 2,000 mL (2,000 mLs Intravenous New Bag/Given 04/05/18 1726)  vancomycin (VANCOCIN) 2,500 mg in sodium chloride 0.9 % 500 mL IVPB (0 mg Intravenous Stopped 04/05/18 2003)  iopamidol (ISOVUE-370) 76 % injection 100 mL (100 mLs Intravenous Contrast Given 04/05/18 1843)  ibuprofen (ADVIL,MOTRIN) tablet 600 mg (600 mg Oral Given 04/05/18 2015)  diphenhydrAMINE (BENADRYL) injection 25 mg (25 mg Intravenous Given 04/05/18 2013)     Initial Impression / Assessment and Plan / ED Course  I have reviewed the triage vital signs and the nursing notes.  Pertinent labs & imaging results that were available during my care of the patient were reviewed by me and considered in my medical decision making (see chart for details).     Mikaelyn Vanwey is a 37 year old female with history of hypothyroidism who presents to the ED with generalized body pain including in the low back, chest with associated shortness of breath.  Patient states that symptoms started about 5 or 6  days ago.  Patient febrile, tachycardic upon arrival but normal blood pressure.  Denies any cough, sputum production, nausea, vomiting, domino pain, urinary symptoms.  She states that she does have a history of thyroid storm in the past which she was treated with radiation and has been on levothyroxine since.  Patient denies any IV drug use, recent infection, recent travel.  Denies any neck pain, neck stiffness, headaches.  Patient overall appears distressed on exam but has negative meningeal signs, no abdominal tenderness, no midline spinal tenderness, no signs of throat infection.  Patient with likely  sepsis and will get blood cultures, chest x-ray, urinalysis, flu panel.  We will also obtain lactic acid, EKG basic labs.  Patient empirically started on Rocephin.  Patient with elevated lactic acid at 2.14 elevated to 3.8 however patient did not receive any fluids prior to this repeat lactic acid.  But patient was given 3 L of normal saline.  Patient had antibiotics broadened to vancomycin and Zosyn.  Chest x-ray showed no signs of pneumonia.  Urinalysis showed no signs of infection.  Flu test was negative.  Patient had persistent tachycardia and obtain CT PE study that showed no pneumonia or no PE.  Patient did have slightly elevated T4 but I have low suspicion for thyroid storm given mild elevation.  Patient did have mild leukocytosis and concern for sepsis.  Unknown of origin at this time possibly occult bacteremia.  Patient otherwise feeling improved following Tylenol and Motrin.  After 1 L of fluids lactic acid improved to 2.84.  Possibly viral process.  EKG showed sinus rhythm.  No signs to suggest pericarditis.  Troponin normal and low concern for myocarditis. Patient to be admitted to medicine for further care and following of cultures.  Final Clinical Impressions(s) / ED Diagnoses   Final diagnoses:  Sepsis, due to unspecified organism [facility])    ED Discharge Orders    None      Virgina Norfolk,  DO 04/05/18 2030  Alvira Monday, MD 04/07/18 1610  Alvira Monday, MD 04/07/18 2151

## 2018-04-05 NOTE — ED Notes (Signed)
Pt in CT.

## 2018-04-05 NOTE — ED Provider Notes (Addendum)
Patient placed in Quick Look pathway, seen and evaluated   Chief Complaint: Chest pain, SOB, tachycardia  HPI:   Patient with history of hypo-thyroidism currently on 137.5 mcg of Synthroid daily -- presents with complaint of generalized body pain including lower back pain and chest pain, shortness of breath since this past weekend (5-6 days).  Patient was seen at an urgent care and had a check for blood clot --states was normal.  She does not have a history of this.  She denies fever, cough.  No nausea, vomiting, or diarrhea, urinary symptoms.  States she felt similar with episode of thyroid storm in the past.  Last thyroid check was reportedly 5 to 6 months ago.  ROS: MSK + myalgias; Cardio + CP  Physical Exam:   Gen: No distress  Neuro: Awake and Alert  Skin: Warm    Focused Exam: Gen tearful; Heart regular, tachycardia, nml S1,S2, no m/r/g; Lungs CTAB; Abd soft, NT, no rebound or guarding; Ext 2+ pedal pulses bilaterally, trace edema symmetric. ED ECG REPORT   Date: 04/05/2018  Rate:120  Rhythm: sinus tachycardia  QRS Axis: normal  Intervals: normal  ST/T Wave abnormalities: nonspecific T wave changes  Conduction Disutrbances:none  Narrative Interpretation:   Old EKG Reviewed: faster from 2012  I have personally reviewed the EKG tracing and agree with the computerized printout as noted.    Initiation of care has begun. EKG reviewed. Thyroid labs ordered. Lactic acid ordered 2/2 fever. The patient has been counseled on the process, plan, and necessity for staying for the completion/evaluation, and the remainder of the medical screening examination    Renne CriglerGeiple, Marketa Midkiff, PA-C 04/05/18 1423    Renne CriglerGeiple, Oanh Devivo, PA-C 04/05/18 1424    Abelino DerrickMackuen, Courteney Lyn, MD 04/11/18 1459

## 2018-04-05 NOTE — ED Triage Notes (Signed)
Pt states she has been having left side chest pain and pain in her back, and left leg since Monday. Pt was seen at urgent care states they took her blood to test for a clot and told her everything was normal.

## 2018-04-05 NOTE — Progress Notes (Addendum)
Patient experienced red man syndrome after starting vancomycin > vancomycin rate reduced to 500 mg/hr.  Pharmacy Antibiotic Note  Yolanda Graves is a 37 y.o. female admitted on 04/05/2018 with sepsis.  Pharmacy has been consulted for vancomycin dosing. Normalized CrCl~100.  Plan: Vancomycin 2,500 mg IV loading dose x 1 Vancomycin 1,000 mg IV every 8 hours.  Goal trough 15-20 mcg/mL. F/U cultures, LOT, renal function, and vancomycin trough as needed.  Height: 5' (152.4 cm) Weight: 236 lb (107 kg) IBW/kg (Calculated) : 45.5  Temp (24hrs), Avg:102.5 F (39.2 C), Min:102 F (38.9 C), Max:103 F (39.4 C)  Recent Labs  Lab 04/05/18 1415 04/05/18 1430 04/05/18 1657  WBC 14.2*  --   --   CREATININE 0.86  --   --   LATICACIDVEN  --  2.14* 3.85*    Estimated Creatinine Clearance: 98.2 mL/min (by C-G formula based on SCr of 0.86 mg/dL).    No Known Allergies  Antimicrobials this admission: Ceftriaxone 4/25 x 1 Vancomycin 4/25 >>  Dose adjustments this admission: none  Microbiology results: Bcx pending  Thank you for allowing pharmacy to be a part of this patient's care.  Yolanda Graves 04/05/2018 5:08 PM

## 2018-04-05 NOTE — H&P (Addendum)
TRH H&P   Patient Demographics:    Yolanda Graves, is a 37 y.o. female  MRN: [id number]   DOB - [birth year]  Admit Date - 04/05/2018  Outpatient Primary MD for the patient is Belva Bertin, [place], [place]  Referring MD/NP/PA:   Ronnald Nian  Outpatient Specialists:    Patient coming from:  home  Chief Complaint  Patient presents with  . Chest Pain      HPI:    Yolanda Graves  is a 37 y.o. female, w hypothyroidism, w hx of thyroid storm with RAI ablation, apparently c/o chest pain for a few days.  And then developed subjective fever today. + slight heartburn.  Pt had travelled to [place] recently but denies any tick exposure, odd food eaten.  Pt denies headache , photophobia, palp, sob, n/v, gerd, diarrhea, brbpr, dysuria, hematuria, rash, joint pain.  Pt noted slight myalgia.    In ED,  Urinalysis negative.  Influenza negative  CTA chest IMPRESSION: Normal examination. No pulmonary emboli. No other cause of intermittent chest pain identified.  Wbc 14.2, hgb 12.7, Plt 297 Na 136, K 4.1, Bun 7, Creatinine 0.86 Ast 25, Alt 20 Alk phos 57, T. Bili <0.1  TSH 0.447 Free T 4 1.31  ESR 17 Lactic acid 2.84, (prev 3.85)  Ekg ST at 120, nl axis, slight pr depression,   Pt will be admitted for fever of unclear origin as well as chest pain.         Review of systems:    In addition to the HPI above,   No Headache, No changes with Vision or hearing, No problems swallowing food or Liquids, No Cough or Shortness of Breath, No Abdominal pain, No Nausea or Vommitting, Bowel movements are regular, No Blood in stool or Urine, No dysuria, No new skin rashes or bruises, No new joints pains-aches,  No new weakness, tingling, numbness in any extremity, No recent weight gain or loss, No polyuria, polydypsia or polyphagia, No significant Mental Stressors.  A full 10 point  Review of Systems was done, except as stated above, all other Review of Systems were negative.   With Past History of the following :    Past Medical History:  Diagnosis Date  . Thyroid disease    hpyothyroid      Past Surgical History:  Procedure Laterality Date  . TUBAL LIGATION        Social History:     Social History   Tobacco Use  . Smoking status: Never Smoker  . Smokeless tobacco: Never Used  Substance Use Topics  . Alcohol use: No     Lives - at home  Mobility - walks by self   Family History :    History reviewed. No pertinent family history. + thyroid   Home Medications:   Prior to Admission medications   Medication Sig Start Date End Date Taking? Authorizing Provider  acetaminophen (TYLENOL) 325 MG tablet Take 650 mg by mouth every 6 (six) hours as needed for headache.    [provider]  levothyroxine (SYNTHROID, LEVOTHROID) 112 MCG tablet Take 112 mcg by mouth daily before breakfast.    [provider]  Phenyleph-Doxylamine-DM-APAP (ALKA SELTZER PLUS PO) Take 1 capsule by mouth daily as needed (congestion).    [provider]  prochlorperazine (COMPAZINE) 10 MG tablet Take 1 tablet (10 mg total) by mouth 2 (two) times daily as needed for vomiting (headache). 01/15/16   Everlene Balls, MD     Allergies:    No Known Allergies   Physical Exam:   Vitals  Blood pressure (!) 101/58, pulse (!) 115, temperature 98.4 F (36.9 C), temperature source Oral, resp. rate (!) 23, height 5' (1.524 m), weight 107 kg (236 lb), SpO2 96 %.   1. General lying in bed in NAD,  Neck supple   2. Normal affect and insight, Not Suicidal or Homicidal, Awake Alert, Oriented X 3.  3. No F.N deficits, ALL C.Nerves Intact, Strength 5/5 all 4 extremities, Sensation intact all 4 extremities, Plantars down going.  4. Ears and Eyes appear Normal, Conjunctivae clear, PERRLA. No photophobia Moist Oral Mucosa.  5. Supple Neck, No JVD, No cervical  lymphadenopathy appriciated, No Carotid Bruits.  6. Symmetrical Chest wall movement, Good air movement bilaterally, CTAB.  7.tachy s1, s2, no m/g/r  8. Positive Bowel Sounds, Abdomen Soft, No tenderness, No organomegaly appriciated,No rebound -guarding or rigidity.  9.  No Cyanosis, Normal Skin Turgor, No Skin Rash or Bruise.  10. Good muscle tone,  joints appear normal , no effusions, Normal ROM.  11. No Palpable Lymph Nodes in Neck or Axillae  No janeway, no osler, no splinter, negative kernig, neg brudzinski    Data Review:    CBC Recent Labs  Lab 04/05/18 1415  WBC 14.2*  HGB 12.7  HCT 39.7  PLT 297  MCV 82.9  MCH 26.5  MCHC 32.0  RDW 15.9*   ------------------------------------------------------------------------------------------------------------------  Chemistries  Recent Labs  Lab 04/05/18 1415  NA 136  K 4.1  CL 101  CO2 25  GLUCOSE 137*  BUN 7  CREATININE 0.86  CALCIUM 9.6  AST 25  ALT 20  ALKPHOS 57  BILITOT 0.6   ------------------------------------------------------------------------------------------------------------------ estimated creatinine clearance is 98.2 mL/min (by C-G formula based on SCr of 0.86 mg/dL). ------------------------------------------------------------------------------------------------------------------ Recent Labs    04/05/18 1413  TSH 0.447    Coagulation profile No results for input(s): INR, PROTIME in the last 168 hours. ------------------------------------------------------------------------------------------------------------------- No results for input(s): DDIMER in the last 72 hours. -------------------------------------------------------------------------------------------------------------------  Cardiac Enzymes No results for input(s): CKMB, TROPONINI, MYOGLOBIN in the last 168 hours.  Invalid input(s):  CK ------------------------------------------------------------------------------------------------------------------ No results found for: BNP   ---------------------------------------------------------------------------------------------------------------  Urinalysis    Component Value Date/Time   COLORURINE YELLOW 04/05/2018 [place] 04/05/2018 1639   LABSPEC 1.021 04/05/2018 1639   PHURINE 7.0 04/05/2018 1639   GLUCOSEU NEGATIVE 04/05/2018 1639   HGBUR NEGATIVE 04/05/2018 1639   BILIRUBINUR NEGATIVE 04/05/2018 1639   KETONESUR NEGATIVE 04/05/2018 1639   PROTEINUR NEGATIVE 04/05/2018 1639   NITRITE NEGATIVE 04/05/2018 1639   LEUKOCYTESUR NEGATIVE 04/05/2018 1639    ----------------------------------------------------------------------------------------------------------------   Imaging Results:    Dg Chest 2 View  Result Date: 04/05/2018 CLINICAL DATA:  37 y/o F; fever, chest pain, shortness of breath, and numbness in the legs for 3 days. EXAM: CHEST - 2 VIEW COMPARISON:  10/09/2014 chest radiograph  FINDINGS: Stable heart size and mediastinal contours are within normal limits. Both lungs are clear. The visualized skeletal structures are unremarkable. IMPRESSION: No active cardiopulmonary disease. Electronically Signed   By: Kristine Garbe M.D.   On: 04/05/2018 15:11   Ct Angio Chest Pe W And/or Wo Contrast  Result Date: 04/05/2018 CLINICAL DATA:  Intermittent chest pain over the last 6 days. EXAM: CT ANGIOGRAPHY CHEST WITH CONTRAST TECHNIQUE: Multidetector CT imaging of the chest was performed using the standard protocol during bolus administration of intravenous contrast. Multiplanar CT image reconstructions and MIPs were obtained to evaluate the vascular anatomy. CONTRAST:  189m ISOVUE-370 IOPAMIDOL (ISOVUE-370) INJECTION 76% COMPARISON:  Radiography same day FINDINGS: Cardiovascular: Pulmonary arterial opacification is moderate. No pulmonary emboli  are seen. No aortic atherosclerosis. Heart size is normal. No coronary artery calcification. No pericardial fluid. Mediastinum/Nodes: Normal Lungs/Pleura: Normal Upper Abdomen: Normal Musculoskeletal: Normal Review of the MIP images confirms the above findings. IMPRESSION: Normal examination. No pulmonary emboli. No other cause of intermittent chest pain identified. Electronically Signed   By: MNelson ChimesM.D.   On: 04/05/2018 19:04       Assessment & Plan:    Principal Problem:   Fever Active Problems:   Tachycardia   Leukocytosis   Hyperglycemia    Fever unclear source ? Early sepsis vs pericarditis vs FUO Blood culture x2 pending Urine culture pending Check viral respiratory panel Check Acute hepatitis panel Check HIV Check RF, Anti CCP abx Check ANA Check LDH Check Myeloma panel Check CT abd/ pelvis Check coxsackie A, B Check cardiac echo Hydrate with ns iv Check cbc, cmp in am Consider LP if develops headache or neck stiffness Please consult ID in AM vanco iv pharmacy to dose zosyn iv pharmacy to dose  Chest pain Tele Trop I q6h x3 Check cpk, mb Check Check EKG in am  Leukocytosis Check cbc in am  Hyperglycemia Check hga1c  Hypothyroidism Cont levothyroxine    DVT Prophylaxis  Lovenox - SCDs   AM Labs Ordered, also please review Full Orders  Family Communication: Admission, patients condition and plan of care including tests being ordered have been discussed with the patient who indicate understanding and agree with the plan and Code Status.  Code Status FULL CODE  Likely DC to  home  Condition GUARDED    Consults called: none  Admission status: inpatient   Time spent in minutes : 45   JJani GravelM.D on 04/05/2018 at 11:17 PM  Between 7am to 7pm - Pager - [phone] After 7pm go to [email or web address] - password [facility] - Office  [phone]

## 2018-04-06 ENCOUNTER — Inpatient Hospital Stay (HOSPITAL_COMMUNITY): Payer: 59

## 2018-04-06 DIAGNOSIS — R739 Hyperglycemia, unspecified: Secondary | ICD-10-CM | POA: Diagnosis not present

## 2018-04-06 DIAGNOSIS — R509 Fever, unspecified: Secondary | ICD-10-CM | POA: Diagnosis not present

## 2018-04-06 DIAGNOSIS — R000 Tachycardia, unspecified: Secondary | ICD-10-CM | POA: Diagnosis not present

## 2018-04-06 DIAGNOSIS — D72829 Elevated white blood cell count, unspecified: Secondary | ICD-10-CM | POA: Diagnosis not present

## 2018-04-06 DIAGNOSIS — A419 Sepsis, unspecified organism: Secondary | ICD-10-CM | POA: Diagnosis not present

## 2018-04-06 DIAGNOSIS — R6510 Systemic inflammatory response syndrome (SIRS) of non-infectious origin without acute organ dysfunction: Secondary | ICD-10-CM | POA: Diagnosis not present

## 2018-04-06 LAB — COMPREHENSIVE METABOLIC PANEL
ALK PHOS: 45 U/L (ref 38–126)
ALT: 21 U/L (ref 14–54)
AST: 24 U/L (ref 15–41)
Albumin: 3 g/dL — ABNORMAL LOW (ref 3.5–5.0)
Anion gap: 9 (ref 5–15)
CALCIUM: 8.1 mg/dL — AB (ref 8.9–10.3)
CO2: 21 mmol/L — AB (ref 22–32)
CREATININE: 0.68 mg/dL (ref 0.44–1.00)
Chloride: 106 mmol/L (ref 101–111)
Glucose, Bld: 152 mg/dL — ABNORMAL HIGH (ref 65–99)
Potassium: 3.5 mmol/L (ref 3.5–5.1)
Sodium: 136 mmol/L (ref 135–145)
Total Bilirubin: 0.6 mg/dL (ref 0.3–1.2)
Total Protein: 5.8 g/dL — ABNORMAL LOW (ref 6.5–8.1)

## 2018-04-06 LAB — ECHOCARDIOGRAM COMPLETE
Height: 60 in
Weight: 3776 oz

## 2018-04-06 LAB — RESPIRATORY PANEL BY PCR
ADENOVIRUS-RVPPCR: NOT DETECTED
Bordetella pertussis: NOT DETECTED
CHLAMYDOPHILA PNEUMONIAE-RVPPCR: NOT DETECTED
CORONAVIRUS 229E-RVPPCR: NOT DETECTED
CORONAVIRUS NL63-RVPPCR: NOT DETECTED
Coronavirus HKU1: NOT DETECTED
Coronavirus OC43: NOT DETECTED
INFLUENZA A-RVPPCR: NOT DETECTED
Influenza B: NOT DETECTED
MYCOPLASMA PNEUMONIAE-RVPPCR: NOT DETECTED
Metapneumovirus: NOT DETECTED
PARAINFLUENZA VIRUS 1-RVPPCR: NOT DETECTED
Parainfluenza Virus 2: NOT DETECTED
Parainfluenza Virus 3: NOT DETECTED
Parainfluenza Virus 4: NOT DETECTED
Respiratory Syncytial Virus: NOT DETECTED
Rhinovirus / Enterovirus: NOT DETECTED

## 2018-04-06 LAB — CK TOTAL AND CKMB (NOT AT ARMC)
CK TOTAL: 100 U/L (ref 38–234)
CK, MB: 0.7 ng/mL (ref 0.5–5.0)
RELATIVE INDEX: 0.7 (ref 0.0–2.5)

## 2018-04-06 LAB — T3: T3 TOTAL: 109 ng/dL (ref 71–180)

## 2018-04-06 LAB — CBC
HCT: 34.7 % — ABNORMAL LOW (ref 36.0–46.0)
Hemoglobin: 10.8 g/dL — ABNORMAL LOW (ref 12.0–15.0)
MCH: 25.7 pg — AB (ref 26.0–34.0)
MCHC: 31.1 g/dL (ref 30.0–36.0)
MCV: 82.4 fL (ref 78.0–100.0)
PLATELETS: 247 10*3/uL (ref 150–400)
RBC: 4.21 MIL/uL (ref 3.87–5.11)
RDW: 15.8 % — ABNORMAL HIGH (ref 11.5–15.5)
WBC: 11.2 10*3/uL — AB (ref 4.0–10.5)

## 2018-04-06 LAB — URINE CULTURE: Culture: <10000 — AB

## 2018-04-06 LAB — TROPONIN I: Troponin I: <0.03 ng/mL (ref <0.03)

## 2018-04-06 LAB — C-REACTIVE PROTEIN: CRP: 4.2 mg/dL — ABNORMAL HIGH (ref <1.0)

## 2018-04-06 LAB — HIV ANTIBODY (ROUTINE TESTING W REFLEX): HIV SCREEN 4TH GENERATION: NONREACTIVE

## 2018-04-06 LAB — LACTATE DEHYDROGENASE: LDH: 139 U/L (ref 98–192)

## 2018-04-06 MED ORDER — VITAMIN B-12 1000 MCG PO TABS
1000.0000 ug | ORAL_TABLET | Freq: Every day | ORAL | Status: DC
  Administered 2018-04-06 – 2018-04-08 (×3): 1000 ug via ORAL
  Filled 2018-04-06 (×3): qty 1

## 2018-04-06 MED ORDER — SODIUM CHLORIDE 0.9 % IV SOLN
INTRAVENOUS | Status: DC
  Administered 2018-04-06: 08:00:00 via INTRAVENOUS

## 2018-04-06 MED ORDER — ENOXAPARIN SODIUM 40 MG/0.4ML ~~LOC~~ SOLN
40.0000 mg | Freq: Every day | SUBCUTANEOUS | Status: DC
  Administered 2018-04-06 – 2018-04-08 (×3): 40 mg via SUBCUTANEOUS
  Filled 2018-04-06 (×3): qty 0.4

## 2018-04-06 MED ORDER — METOPROLOL TARTRATE 12.5 MG HALF TABLET
12.5000 mg | ORAL_TABLET | Freq: Two times a day (BID) | ORAL | Status: AC
  Administered 2018-04-06 – 2018-04-07 (×2): 12.5 mg via ORAL
  Filled 2018-04-06 (×3): qty 1

## 2018-04-06 MED ORDER — ONDANSETRON HCL 4 MG/2ML IJ SOLN: 4.0000 mg | Freq: Four times a day (QID) | INTRAMUSCULAR | Status: DC | PRN

## 2018-04-06 MED ORDER — SODIUM CHLORIDE 0.9 % IV SOLN
INTRAVENOUS | Status: AC
Start: 2018-04-06 — End: 2018-04-07

## 2018-04-06 MED ORDER — LEVOTHYROXINE SODIUM 137 MCG PO TABS
137.0000 ug | ORAL_TABLET | Freq: Every day | ORAL | Status: DC
  Administered 2018-04-06 – 2018-04-08 (×3): 137 ug via ORAL
  Filled 2018-04-06 (×3): qty 1

## 2018-04-06 MED ORDER — ACETAMINOPHEN 325 MG PO TABS: 650.0000 mg | ORAL_TABLET | Freq: Four times a day (QID) | ORAL | Status: DC | PRN

## 2018-04-06 MED ORDER — PIPERACILLIN-TAZOBACTAM 3.375 G IVPB
3.3750 g | Freq: Three times a day (TID) | INTRAVENOUS | Status: DC
  Administered 2018-04-06 (×2): 3.375 g via INTRAVENOUS
  Filled 2018-04-06 (×3): qty 50

## 2018-04-06 MED ORDER — LEVOTHYROXINE SODIUM 112 MCG PO TABS: 112.0000 ug | ORAL_TABLET | Freq: Every day | ORAL | Status: DC

## 2018-04-06 NOTE — Progress Notes (Signed)
Pharmacy Antibiotic Note  Gearldine Shownequilla Clock is a 37 y.o. female admitted on 04/05/2018 with sepsis.  Pharmacy has been consulted for Zosyn dosing. Fever with unclear source. WBC elevated. LA elevated. Renal function ok.   Plan: Already on Vancomycin  Zosyn 3.375G IV q8h to be infused over 4 hours Trend WBC, temp, renal function  F/U infectious work-up Drug levels as indicated  Height: 5' (152.4 cm) Weight: 236 lb (107 kg) IBW/kg (Calculated) : 45.5  Temp (24hrs), Avg:101.3 F (38.5 C), Min:98.4 F (36.9 C), Max:103 F (39.4 C)  Recent Labs  Lab 04/05/18 1415 04/05/18 1430 04/05/18 1657 04/05/18 1932  WBC 14.2*  --   --   --   CREATININE 0.86  --   --   --   LATICACIDVEN  --  2.14* 3.85* 2.84*    Estimated Creatinine Clearance: 98.2 mL/min (by C-G formula based on SCr of 0.86 mg/dL).    No Known Allergies  Abran DukeLedford, Neeya Prigmore 04/06/2018 12:46 AM

## 2018-04-06 NOTE — ED Notes (Signed)
Started pt Oral contrast.  Called CT to inform them.

## 2018-04-06 NOTE — ED Notes (Signed)
Heart Healthy Diet was ordered for Lunch. 

## 2018-04-06 NOTE — ED Notes (Signed)
Woke pt to remind her to drink contrast

## 2018-04-06 NOTE — Progress Notes (Signed)
[facility] TEAM 1 - Stepdown/ICU MARGURETE GUAMAN Spooner  WYO:[id number] DOB: [birth year] DOA: 04/05/2018 PCP: Elisabeth Cara, PA-C    Brief Narrative:  37 y.o. 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  In the ED CTa chest was unremarkable.  UDS was negative.    Significant Events: 4/25 admit  4/26 TTE -   Subjective: Patient is laying in bed.  She reports ongoing subjective fevers.  She feels warm to the touch at the time of my exam.  She reports low appetite and poor intake.  She denies nausea or vomiting or diarrhea.  She denies current chest pain or shortness of breath.  She states she feels tired and that her legs continue to swell.  Review of history reveals acute onset of lower extremity deem a dyspnea on exertion and generalized weakness associated with recurring low-grade fevers.  She denies any other symptoms and has not had any recent upper respiratory tract infections that she can recall.  Assessment & Plan:  FUO Flu negative - RVP negative - HIV negative - UA unrevealing - ESR normal - CRP elevated - CT abdom w/o signif acute findings - stop abx as we have no idea what we are treating   Sinus tachycardia - lactic acidosis  Concerning for a cardiomyopathy (?post-viral) - TTE pending - appears volume overloaded on exam - add BB - gently hydrate as tolerated   Hypothyroidism s/p RAI ablation TSH 0.45 - T3 normal and FT4 only very mildly elevated   Hyperglycemia  Check A1c  DVT prophylaxis: lovenox  Code Status: FULL CODE Family Communication: spoke w/ mother and sister at bedside  Disposition Plan: SDU  Consultants:  none  Antimicrobials:  Zosyn 4/25 > 4/26 Vanc 4/25 > 4/26  Objective: Blood pressure 116/80, pulse (!) 117, temperature 99.7 F (37.6 C), temperature source Oral, resp. rate (!) 31, height 5' (1.524 m), weight 107 kg (236 lb), SpO2 100  %.  Intake/Output Summary (Last 24 hours) at 04/06/2018 1531 Last data filed at 04/06/2018 1238 Gross per 24 hour  Intake 4000 ml  Output 2300 ml  Net 1700 ml   Filed Weights   04/05/18 1400  Weight: 107 kg (236 lb)    Examination: General: No acute respiratory distress Lungs: Clear to auscultation bilaterally without wheezes or crackles Cardiovascular: tachycardic - regular - no M or rub  Abdomen: Nontender, nondistended, soft, bowel sounds positive, no rebound, no ascites, no appreciable mass Extremities: 1+ anasarca   CBC: Recent Labs  Lab 04/05/18 1415 04/06/18 0826  WBC 14.2* 11.2*  HGB 12.7 10.8*  HCT 39.7 34.7*  MCV 82.9 82.4  PLT 297 774   Basic Metabolic Panel: Recent Labs  Lab 04/05/18 1415 04/06/18 0826  NA 136 136  K 4.1 3.5  CL 101 106  CO2 25 21*  GLUCOSE 137* 152*  BUN 7 <5*  CREATININE 0.86 0.68  CALCIUM 9.6 8.1*   GFR: Estimated Creatinine Clearance: 105.5 mL/min (by C-G formula based on SCr of 0.68 mg/dL).  Liver Function Tests: Recent Labs  Lab 04/05/18 1415 04/06/18 0826  AST 25 24  ALT 20 21  ALKPHOS 57 45  BILITOT 0.6 0.6  PROT 8.1 5.8*  ALBUMIN 4.0 3.0*    Cardiac Enzymes: Recent Labs  Lab 04/06/18 0213  CKTOTAL 100  CKMB 0.7  TROPONINI <0.03     Recent Results (from the past  240 hour(s))  Blood culture (routine x 2)     Status: None (Preliminary result)   Collection Time: 04/05/18  4:24 PM  Result Value Ref Range Status   Specimen Description BLOOD SITE NOT SPECIFIED  Final   Special Requests   Final    BOTTLES DRAWN AEROBIC AND ANAEROBIC Blood Culture adequate volume   Culture   Final    NO GROWTH < 24 HOURS Performed at [facility], [place] [address], [place], [place] [address]    Report Status PENDING  Incomplete  Urine culture     Status: Abnormal   Collection Time: 04/05/18  4:42 PM  Result Value Ref Range Status   Specimen Description URINE, CLEAN CATCH  Final   Special Requests NONE  Final    Culture (A)  Final    <10,000 COLONIES/mL INSIGNIFICANT GROWTH Performed at [facility], [place] [address] [place], [place] [address]    Report Status 04/06/2018 FINAL  Final  Blood culture (routine x 2)     Status: None (Preliminary result)   Collection Time: 04/05/18  4:53 PM  Result Value Ref Range Status   Specimen Description BLOOD LEFT HAND  Final   Special Requests   Final    BOTTLES DRAWN AEROBIC ONLY Blood Culture results may not be optimal due to an inadequate volume of blood received in culture bottles   Culture   Final    NO GROWTH < 24 HOURS Performed at [facility], [place] [address] [place], [place] [address]    Report Status PENDING  Incomplete  Respiratory Panel by PCR     Status: None   Collection Time: 04/06/18  2:21 AM  Result Value Ref Range Status   Adenovirus NOT DETECTED NOT DETECTED Final   Coronavirus 229E NOT DETECTED NOT DETECTED Final   Coronavirus HKU1 NOT DETECTED NOT DETECTED Final   Coronavirus NL63 NOT DETECTED NOT DETECTED Final   Coronavirus OC43 NOT DETECTED NOT DETECTED Final   Metapneumovirus NOT DETECTED NOT DETECTED Final   Rhinovirus / Enterovirus NOT DETECTED NOT DETECTED Final   Influenza A NOT DETECTED NOT DETECTED Final   Influenza B NOT DETECTED NOT DETECTED Final   Parainfluenza Virus 1 NOT DETECTED NOT DETECTED Final   Parainfluenza Virus 2 NOT DETECTED NOT DETECTED Final   Parainfluenza Virus 3 NOT DETECTED NOT DETECTED Final   Parainfluenza Virus 4 NOT DETECTED NOT DETECTED Final   Respiratory Syncytial Virus NOT DETECTED NOT DETECTED Final   Bordetella pertussis NOT DETECTED NOT DETECTED Final   Chlamydophila pneumoniae NOT DETECTED NOT DETECTED Final   Mycoplasma pneumoniae NOT DETECTED NOT DETECTED Final     Scheduled Meds: . enoxaparin (LOVENOX) injection  40 mg Subcutaneous Daily  . levothyroxine  137 mcg Oral QAC breakfast     LOS: 1 day   Cherene Altes, MD [facility] Office   [phone] Pager - Text Page per Amion as per below:  On-Call/Text Page:      [email or web address]      password [email or web address]  If 7PM-7AM, please contact night-coverage [email or web address] Password [facility] 04/06/2018, 3:31 PM

## 2018-04-06 NOTE — Progress Notes (Signed)
  Echocardiogram 2D Echocardiogram has been performed.  Roosvelt MaserLane, Natalyn Szymanowski F 04/06/2018, 2:20 PM

## 2018-04-07 DIAGNOSIS — D72829 Elevated white blood cell count, unspecified: Secondary | ICD-10-CM | POA: Diagnosis not present

## 2018-04-07 DIAGNOSIS — R739 Hyperglycemia, unspecified: Secondary | ICD-10-CM | POA: Diagnosis not present

## 2018-04-07 DIAGNOSIS — R000 Tachycardia, unspecified: Secondary | ICD-10-CM | POA: Diagnosis not present

## 2018-04-07 DIAGNOSIS — R6510 Systemic inflammatory response syndrome (SIRS) of non-infectious origin without acute organ dysfunction: Secondary | ICD-10-CM

## 2018-04-07 DIAGNOSIS — R509 Fever, unspecified: Secondary | ICD-10-CM | POA: Diagnosis not present

## 2018-04-07 LAB — COMPREHENSIVE METABOLIC PANEL
ALT: 30 U/L (ref 14–54)
AST: 29 U/L (ref 15–41)
Albumin: 3.1 g/dL — ABNORMAL LOW (ref 3.5–5.0)
Alkaline Phosphatase: 57 U/L (ref 38–126)
Anion gap: 8 (ref 5–15)
BILIRUBIN TOTAL: 0.5 mg/dL (ref 0.3–1.2)
BUN: 5 mg/dL — ABNORMAL LOW (ref 6–20)
CO2: 23 mmol/L (ref 22–32)
CREATININE: 0.66 mg/dL (ref 0.44–1.00)
Calcium: 8.5 mg/dL — ABNORMAL LOW (ref 8.9–10.3)
Chloride: 106 mmol/L (ref 101–111)
Glucose, Bld: 116 mg/dL — ABNORMAL HIGH (ref 65–99)
Potassium: 3.8 mmol/L (ref 3.5–5.1)
Sodium: 137 mmol/L (ref 135–145)
Total Protein: 6.6 g/dL (ref 6.5–8.1)

## 2018-04-07 LAB — RETICULOCYTES
RBC.: 4.26 MIL/uL (ref 3.87–5.11)
RETIC CT PCT: 1.9 % (ref 0.4–3.1)
Retic Count, Absolute: 80.9 10*3/uL (ref 19.0–186.0)

## 2018-04-07 LAB — CBC
HCT: 35.5 % — ABNORMAL LOW (ref 36.0–46.0)
Hemoglobin: 11.1 g/dL — ABNORMAL LOW (ref 12.0–15.0)
MCH: 26.1 pg (ref 26.0–34.0)
MCHC: 31.3 g/dL (ref 30.0–36.0)
MCV: 83.3 fL (ref 78.0–100.0)
PLATELETS: 248 10*3/uL (ref 150–400)
RBC: 4.26 MIL/uL (ref 3.87–5.11)
RDW: 16.4 % — ABNORMAL HIGH (ref 11.5–15.5)
WBC: 9.5 10*3/uL (ref 4.0–10.5)

## 2018-04-07 LAB — VITAMIN B12: Vitamin B-12: 360 pg/mL (ref 180–914)

## 2018-04-07 LAB — RHEUMATOID FACTOR: Rheumatoid fact SerPl-aCnc: 12.8 [IU]/mL (ref 0.0–13.9)

## 2018-04-07 LAB — IRON AND TIBC
Iron: 19 ug/dL — ABNORMAL LOW (ref 28–170)
SATURATION RATIOS: 6 % — AB (ref 10.4–31.8)
TIBC: 293 ug/dL (ref 250–450)
UIBC: 274 ug/dL

## 2018-04-07 LAB — HEPATITIS PANEL, ACUTE
HCV Ab: <0.1 {s_co_ratio} (ref 0.0–0.9)
Hep A IgM: NEGATIVE
Hep B C IgM: NEGATIVE
Hepatitis B Surface Ag: NEGATIVE

## 2018-04-07 LAB — ANA: Anti Nuclear Antibody(ANA): NEGATIVE

## 2018-04-07 LAB — FOLATE: Folate: 19.8 ng/mL (ref >5.9)

## 2018-04-07 LAB — FERRITIN: FERRITIN: 38 ng/mL (ref 11–307)

## 2018-04-07 LAB — HEMOGLOBIN A1C
Hgb A1c MFr Bld: 6.1 % — ABNORMAL HIGH (ref 4.8–5.6)
Mean Plasma Glucose: 128.37 mg/dL

## 2018-04-07 LAB — CORTISOL: Cortisol, Plasma: 12.1 ug/dL

## 2018-04-07 NOTE — Progress Notes (Signed)
[place] TEAM 1 - Stepdown/ICU LISAANN ATHA Halfmann  KKX:[id number] DOB: 05/06/1981 DOA: 04/05/2018 PCP: Elisabeth Cara, PA-C    Brief Narrative:  37 y.o. 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  In the ED CTa chest was unremarkable.  UDS was negative.    Significant Events: 4/25 admit  4/26 TTE - EF 60-65% - no WMA - grade 1 DD  Subjective: No new complaints today.  Reports that she is feeling better overall.  No cp sob n/v or abdom pain.  No subjective fever last night.     Assessment & Plan:  FUO Flu negative - RVP negative - HIV negative - UA unrevealing - ESR normal - CRP elevated - CT abdom w/o signif acute findings - stopped abx as we had no idea what we are treating - no further fever - WBC has normalized - cont to follow off abx   Sinus tachycardia - lactic acidosis  TTE w/o evidence of CM - resolved w/ BB and gentle hydration - AM cortisol is normal - stop IVF tonight - stop BB - f/u in AM   Hypothyroidism s/p RAI ablation TSH 0.45 - T3 normal and FT4 only very mildly elevated   Hyperglycemia  A1c 6.1 therefore not yet DM   DVT prophylaxis: lovenox  Code Status: FULL CODE Family Communication: spoke w/ mother and sister at bedside  Disposition Plan: transfer to tele - possible D/C home 4/28 if HR and temp stable   Consultants:  none  Antimicrobials:  Zosyn 4/25 > 4/26 Vanc 4/25 > 4/26  Objective: Blood pressure 107/63, pulse 87, temperature 98 F (36.7 C), temperature source Oral, resp. rate (!) 21, height 5' (1.524 m), weight 107.1 kg (236 lb 3.2 oz), SpO2 98 %.  Intake/Output Summary (Last 24 hours) at 04/07/2018 1524 Last data filed at 04/06/2018 1800 Gross per 24 hour  Intake 0 ml  Output -  Net 0 ml   Filed Weights   04/05/18 1400 04/07/18 0500  Weight: 107 kg (236 lb) 107.1 kg (236 lb 3.2 oz)    Examination: General: No acute  respiratory distress - A&O x4 Lungs: CTA B - no wheezing  Cardiovascular: RRR - no M or rub   Abdomen: NT/ND, soft, bs+, no mass  Extremities: 1+ anasarca w/o change   CBC: Recent Labs  Lab 04/05/18 1415 04/06/18 0826 04/07/18 0704  WBC 14.2* 11.2* 9.5  HGB 12.7 10.8* 11.1*  HCT 39.7 34.7* 35.5*  MCV 82.9 82.4 83.3  PLT 297 247 169   Basic Metabolic Panel: Recent Labs  Lab 04/05/18 1415 04/06/18 0826 04/07/18 0704  NA 136 136 137  K 4.1 3.5 3.8  CL 101 106 106  CO2 25 21* 23  GLUCOSE 137* 152* 116*  BUN 7 <5* 5*  CREATININE 0.86 0.68 0.66  CALCIUM 9.6 8.1* 8.5*   GFR: Estimated Creatinine Clearance: 105.5 mL/min (by C-G formula based on SCr of 0.66 mg/dL).  Liver Function Tests: Recent Labs  Lab 04/05/18 1415 04/06/18 0826 04/07/18 0704  AST _0 ALT _1 ALKPHOS 57 45 57  BILITOT 0.6 0.6 0.5  PROT 8.1 5.8* 6.6  ALBUMIN 4.0 3.0* 3.1*    Cardiac Enzymes: Recent Labs  Lab 04/06/18 0213 04/06/18 1524  CKTOTAL 100  --   CKMB 0.7  --   TROPONINI <0.03 <0.03  Recent Results (from the past 240 hour(s))  Blood culture (routine x 2)     Status: None (Preliminary result)   Collection Time: 04/05/18  4:24 PM  Result Value Ref Range Status   Specimen Description BLOOD SITE NOT SPECIFIED  Final   Special Requests   Final    BOTTLES DRAWN AEROBIC AND ANAEROBIC Blood Culture adequate volume   Culture   Final    NO GROWTH 2 DAYS Performed at [facility], [address], [place], [place] [address]    Report Status PENDING  Incomplete  Urine culture     Status: Abnormal   Collection Time: 04/05/18  4:42 PM  Result Value Ref Range Status   Specimen Description URINE, CLEAN CATCH  Final   Special Requests NONE  Final   Culture (A)  Final    <10,000 COLONIES/mL INSIGNIFICANT GROWTH Performed at [facility], [place] [address] [place], [place] [address]    Report Status 04/06/2018 FINAL  Final  Blood culture (routine x 2)      Status: None (Preliminary result)   Collection Time: 04/05/18  4:53 PM  Result Value Ref Range Status   Specimen Description BLOOD LEFT HAND  Final   Special Requests   Final    BOTTLES DRAWN AEROBIC ONLY Blood Culture results may not be optimal due to an inadequate volume of blood received in culture bottles   Culture   Final    NO GROWTH 2 DAYS Performed at [facility], [place] [address], [place],  [address]    Report Status PENDING  Incomplete  Respiratory Panel by PCR     Status: None   Collection Time: 04/06/18  2:21 AM  Result Value Ref Range Status   Adenovirus NOT DETECTED NOT DETECTED Final   Coronavirus 229E NOT DETECTED NOT DETECTED Final   Coronavirus HKU1 NOT DETECTED NOT DETECTED Final   Coronavirus NL63 NOT DETECTED NOT DETECTED Final   Coronavirus OC43 NOT DETECTED NOT DETECTED Final   Metapneumovirus NOT DETECTED NOT DETECTED Final   Rhinovirus / Enterovirus NOT DETECTED NOT DETECTED Final   Influenza A NOT DETECTED NOT DETECTED Final   Influenza B NOT DETECTED NOT DETECTED Final   Parainfluenza Virus 1 NOT DETECTED NOT DETECTED Final   Parainfluenza Virus 2 NOT DETECTED NOT DETECTED Final   Parainfluenza Virus 3 NOT DETECTED NOT DETECTED Final   Parainfluenza Virus 4 NOT DETECTED NOT DETECTED Final   Respiratory Syncytial Virus NOT DETECTED NOT DETECTED Final   Bordetella pertussis NOT DETECTED NOT DETECTED Final   Chlamydophila pneumoniae NOT DETECTED NOT DETECTED Final   Mycoplasma pneumoniae NOT DETECTED NOT DETECTED Final       LOS: 2 days   Cherene Altes, MD [facility] Office  [phone] Pager - Text Page per Shea Evans as per below:  On-Call/Text Page:      [email or web address]      password [email or web address]  If 7PM-7AM, please contact night-coverage [email or web address] Password [facility] 04/07/2018, 3:24 PM

## 2018-04-07 NOTE — Progress Notes (Signed)
Pt refused MRSA swab as part of admission for stepdown.

## 2018-04-08 DIAGNOSIS — R000 Tachycardia, unspecified: Secondary | ICD-10-CM | POA: Diagnosis not present

## 2018-04-08 DIAGNOSIS — R739 Hyperglycemia, unspecified: Secondary | ICD-10-CM | POA: Diagnosis not present

## 2018-04-08 DIAGNOSIS — A419 Sepsis, unspecified organism: Secondary | ICD-10-CM | POA: Diagnosis not present

## 2018-04-08 DIAGNOSIS — R509 Fever, unspecified: Secondary | ICD-10-CM | POA: Diagnosis not present

## 2018-04-08 LAB — CBC
HCT: 36.1 % (ref 36.0–46.0)
Hemoglobin: 11.3 g/dL — ABNORMAL LOW (ref 12.0–15.0)
MCH: 25.6 pg — AB (ref 26.0–34.0)
MCHC: 31.3 g/dL (ref 30.0–36.0)
MCV: 81.7 fL (ref 78.0–100.0)
Platelets: 247 10*3/uL (ref 150–400)
RBC: 4.42 MIL/uL (ref 3.87–5.11)
RDW: 15.5 % (ref 11.5–15.5)
WBC: 7.6 10*3/uL (ref 4.0–10.5)

## 2018-04-08 NOTE — Progress Notes (Signed)
Pt refused her scheduled metoprolol, per pt the attending physician said wants to observe how her HR will be without the medicine and wants her not to take it

## 2018-04-08 NOTE — Discharge Summary (Signed)
DISCHARGE SUMMARY  Yolanda Graves  MR#: [id number]  DOB:1981/01/27  Date of Admission: 04/05/2018 Date of Discharge: 04/08/2018  Attending Physician:Yolanda Hennie Duos, MD   Patient's TJQ:[id number], Yolanda Laos, PA-C  Consults:  None   Disposition: D/C home   Follow-up Appts: Follow-up Information    O'Brien, Worth, PA-C Follow up in 7 day(s).   Specialty:  Family Medicine Why:  F/U with your Primary Care Provider in 7 days if you still do not feel you are back to your normal - if you do feel that you have recovered fully after 7 days, you should simple keep your usual clinic follow-up appointments Contact information: [address] [place] [address] [phone]           Tests Needing Follow-up: The following are pending at the time of d/c (though all are not felt to be of true clinical significance): Quantiferon gold, Multiple Myeloma panel, cyclic citrul peptide Ab, Coxsackie virus A & B testing   Discharge Diagnoses: FUO SIRS v/s Sepsis due to unknown organism  Sinus tachycardia - Lactic acidosis - DH Hypothyroidism s/p RAI ablation Hyperglycemia  Obesity   Initial presentation: 37 y.o.female w/ hx of  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  In the ED CTa chest was unremarkable, but she was found to be febrile, and met criteria for SIRS v/s Sepsis.    Hospital Course:  FUO - SIRS v/s Sepsis due to unknown organism  Flu negative - RVP negative - HIV negative - Hepatitis negative - UA unrevealing - ESR normal - CRP elevated - CT abdom w/o signif acute findings - stopped abx as we had no idea what we are treating - no further fever - WBC normalized - d/c home w/ pt afebrile, normal WBC, SIRS resolved - no clear source able to be identified - clinically suggestive of systemic viral infection > severe DH  Sinus tachycardia - lactic acidosis  TTE w/o  evidence of CM - resolved w/ BB and gentle hydration - AM cortisol normal - resolved prior to d/c home - BB stopped   Hypothyroidism s/p RAI ablation TSH 0.45 - T3 normal and FT4 only very mildly elevated   Hyperglycemia  A1c 6.1 therefore not yet DM - counseled on need for weight loss and healthy diet choices   Obesity - Body mass index is 46.72 kg/m.    Allergies as of 04/08/2018   No Known Allergies     Medication List    TAKE these medications   acetaminophen 325 MG tablet Commonly known as:  TYLENOL Take 650 mg by mouth every 6 (six) hours as needed for headache.   ALKA SELTZER PLUS PO Take 1 capsule by mouth daily as needed (congestion).   levothyroxine 112 MCG tablet Commonly known as:  SYNTHROID, LEVOTHROID Take 137 mcg by mouth daily before breakfast.   prochlorperazine 10 MG tablet Commonly known as:  COMPAZINE Take 1 tablet (10 mg total) by mouth 2 (two) times daily as needed for vomiting (headache).   vitamin B-12 1000 MCG tablet Commonly known as:  CYANOCOBALAMIN Take 1,000 mcg by mouth daily.       Day of Discharge BP 122/80 (BP Location: Left Arm)   Pulse 94   Temp 97.8 F (36.6 C) (Oral)   Resp 17   Ht 5' (1.524 m)   Wt 108.5 kg (239 lb 3.2 oz)   SpO2 99%  BMI 46.72 kg/m   Physical Exam: General: No acute respiratory distress Lungs: Clear to auscultation bilaterally without wheezes or crackles Cardiovascular: Regular rate and rhythm without murmur gallop or rub normal S1 and S2 Abdomen: Nontender, overweight, soft, bowel sounds positive, no rebound, no ascites, no appreciable mass Extremities: No significant cyanosis, clubbing, or edema bilateral lower extremities  Basic Metabolic Panel: Recent Labs  Lab 04/05/18 1415 04/06/18 0826 04/07/18 0704  NA 136 136 137  K 4.1 3.5 3.8  CL 101 106 106  CO2 25 21* 23  GLUCOSE 137* 152* 116*  BUN 7 <5* 5*  CREATININE 0.86 0.68 0.66  CALCIUM 9.6 8.1* 8.5*    Liver Function  Tests: Recent Labs  Lab 04/05/18 1415 04/06/18 0826 04/07/18 0704  AST 25 24 29   ALT 20 21 30   ALKPHOS 57 45 57  BILITOT 0.6 0.6 0.5  PROT 8.1 5.8* 6.6  ALBUMIN 4.0 3.0* 3.1*   CBC: Recent Labs  Lab 04/05/18 1415 04/06/18 0826 04/07/18 0704 04/08/18 0543  WBC 14.2* 11.2* 9.5 7.6  HGB 12.7 10.8* 11.1* 11.3*  HCT 39.7 34.7* 35.5* 36.1  MCV 82.9 82.4 83.3 81.7  PLT 297 247 248 247    Cardiac Enzymes: Recent Labs  Lab 04/06/18 0213 04/06/18 1524  CKTOTAL 100  --   CKMB 0.7  --   TROPONINI <0.03 <0.03     Recent Results (from the past 240 hour(s))  Blood culture (routine x 2)     Status: None (Preliminary result)   Collection Time: 04/05/18  4:24 PM  Result Value Ref Range Status   Specimen Description BLOOD SITE NOT SPECIFIED  Final   Special Requests   Final    BOTTLES DRAWN AEROBIC AND ANAEROBIC Blood Culture adequate volume   Culture   Final    NO GROWTH 3 DAYS Performed at [facility], [place] [address] [place], [place] [address]    Report Status PENDING  Incomplete  Urine culture     Status: Abnormal   Collection Time: 04/05/18  4:42 PM  Result Value Ref Range Status   Specimen Description URINE, CLEAN CATCH  Final   Special Requests NONE  Final   Culture (A)  Final    <10,000 COLONIES/mL INSIGNIFICANT GROWTH Performed at [facility], [place] [address], [place], [place] [address]    Report Status 04/06/2018 FINAL  Final  Blood culture (routine x 2)     Status: None (Preliminary result)   Collection Time: 04/05/18  4:53 PM  Result Value Ref Range Status   Specimen Description BLOOD LEFT HAND  Final   Special Requests   Final    BOTTLES DRAWN AEROBIC ONLY Blood Culture results may not be optimal due to an inadequate volume of blood received in culture bottles   Culture   Final    NO GROWTH 3 DAYS Performed at [facility], [place] [address] [place], [place] [address]    Report Status PENDING  Incomplete  Respiratory Panel by PCR      Status: None   Collection Time: 04/06/18  2:21 AM  Result Value Ref Range Status   Adenovirus NOT DETECTED NOT DETECTED Final   Coronavirus 229E NOT DETECTED NOT DETECTED Final   Coronavirus HKU1 NOT DETECTED NOT DETECTED Final   Coronavirus NL63 NOT DETECTED NOT DETECTED Final   Coronavirus OC43 NOT DETECTED NOT DETECTED Final   Metapneumovirus NOT DETECTED NOT DETECTED Final   Rhinovirus / Enterovirus NOT DETECTED NOT DETECTED  Final   Influenza A NOT DETECTED NOT DETECTED Final   Influenza B NOT DETECTED NOT DETECTED Final   Parainfluenza Virus 1 NOT DETECTED NOT DETECTED Final   Parainfluenza Virus 2 NOT DETECTED NOT DETECTED Final   Parainfluenza Virus 3 NOT DETECTED NOT DETECTED Final   Parainfluenza Virus 4 NOT DETECTED NOT DETECTED Final   Respiratory Syncytial Virus NOT DETECTED NOT DETECTED Final   Bordetella pertussis NOT DETECTED NOT DETECTED Final   Chlamydophila pneumoniae NOT DETECTED NOT DETECTED Final   Mycoplasma pneumoniae NOT DETECTED NOT DETECTED Final     Time spent in discharge (includes decision making & examination of pt): 35 minutes  04/08/2018, 12:07 PM   Cherene Altes, MD [facility] Office  [phone] Pager [phone]  On-Call/Text Page:      [email or web address]      password [facility]

## 2018-04-08 NOTE — Discharge Instructions (Signed)
Dehydration, Adult Dehydration is a condition in which there is not enough fluid or water in the body. This happens when you lose more fluids than you take in. Important organs, such as the kidneys, brain, and heart, cannot function without a proper amount of fluids. Any loss of fluids from the body can lead to dehydration. Dehydration can range from mild to severe. This condition should be treated right away to prevent it from becoming severe. What are the causes? This condition may be caused by:  Vomiting.  Diarrhea.  Excessive sweating, such as from heat exposure or exercise.  Not drinking enough fluid, especially: ? When ill. ? While doing activity that requires a lot of energy.  Excessive urination.  Fever.  Infection.  Certain medicines, such as medicines that cause the body to lose excess fluid (diuretics).  Inability to access safe drinking water.  Reduced physical ability to get adequate water and food.  What increases the risk? This condition is more likely to develop in people:  Who have a poorly controlled long-term (chronic) illness, such as diabetes, heart disease, or kidney disease.  Who are age 39 or older.  Who are disabled.  Who live in a place with high altitude.  Who play endurance sports.  What are the signs or symptoms? Symptoms of mild dehydration may include:  Thirst.  Dry lips.  Slightly dry mouth.  Dry, warm skin.  Dizziness. Symptoms of moderate dehydration may include:  Very dry mouth.  Muscle cramps.  Dark urine. Urine may be the color of tea.  Decreased urine production.  Decreased tear production.  Heartbeat that is irregular or faster than normal (palpitations).  Headache.  Light-headedness, especially when you stand up from a sitting position.  Fainting (syncope). Symptoms of severe dehydration may include:  Changes in skin, such as: ? Cold and clammy skin. ? Blotchy (mottled) or pale skin. ? Skin that does  not quickly return to normal after being lightly pinched and released (poor skin turgor).  Changes in body fluids, such as: ? Extreme thirst. ? No tear production. ? Inability to sweat when body temperature is high, such as in hot weather. ? Very little urine production.  Changes in vital signs, such as: ? Weak pulse. ? Pulse that is more than 100 beats a minute when sitting still. ? Rapid breathing. ? Low blood pressure.  Other changes, such as: ? Sunken eyes. ? Cold hands and feet. ? Confusion. ? Lack of energy (lethargy). ? Difficulty waking up from sleep. ? Short-term weight loss. ? Unconsciousness. How is this diagnosed? This condition is diagnosed based on your symptoms and a physical exam. Blood and urine tests may be done to help confirm the diagnosis. How is this treated? Treatment for this condition depends on the severity. Mild or moderate dehydration can often be treated at home. Treatment should be started right away. Do not wait until dehydration becomes severe. Severe dehydration is an emergency and it needs to be treated in a hospital. Treatment for mild dehydration may include:  Drinking more fluids.  Replacing salts and minerals in your blood (electrolytes) that you may have lost. Treatment for moderate dehydration may include:  Drinking an oral rehydration solution (ORS). This is a drink that helps you replace fluids and electrolytes (rehydrate). It can be found at pharmacies and retail stores. Treatment for severe dehydration may include:  Receiving fluids through an IV tube.  Receiving an electrolyte solution through a feeding tube that is passed through your nose  and into your stomach (nasogastric tube, or NG tube).  Correcting any abnormalities in electrolytes.  Treating the underlying cause of dehydration. Follow these instructions at home:  If directed by your health care provider, drink an ORS: ? Make an ORS by following instructions on the  package. ? Start by drinking small amounts, about  cup (120 mL) every 5-10 minutes. ? Slowly increase how much you drink until you have taken the amount recommended by your health care provider.  Drink enough clear fluid to keep your urine clear or pale yellow. If you were told to drink an ORS, finish the ORS first, then start slowly drinking other clear fluids. Drink fluids such as: ? Water. Do not drink only water. Doing that can lead to having too little salt (sodium) in the body (hyponatremia). ? Ice chips. ? Fruit juice that you have added water to (diluted fruit juice). ? Low-calorie sports drinks.  Avoid: ? Alcohol. ? Drinks that contain a lot of sugar. These include high-calorie sports drinks, fruit juice that is not diluted, and soda. ? Caffeine. ? Foods that are greasy or contain a lot of fat or sugar.  Take over-the-counter and prescription medicines only as told by your health care provider.  Do not take sodium tablets. This can lead to having too much sodium in the body (hypernatremia).  Eat foods that contain a healthy balance of electrolytes, such as bananas, oranges, potatoes, tomatoes, and spinach.  Keep all follow-up visits as told by your health care provider. This is important. Contact a health care provider if:  You have abdominal pain that: ? Gets worse. ? Stays in one area (localizes).  You have a rash.  You have a stiff neck.  You are more irritable than usual.  You are sleepier or more difficult to wake up than usual.  You feel weak or dizzy.  You feel very thirsty.  You have urinated only a small amount of very dark urine over 6-8 hours. Get help right away if:  You have symptoms of severe dehydration.  You cannot drink fluids without vomiting.  Your symptoms get worse with treatment.  You have a fever.  You have a severe headache.  You have vomiting or diarrhea that: ? Gets worse. ? Does not go away.  You have blood or green matter  (bile) in your vomit.  You have blood in your stool. This may cause stool to look black and tarry.  You have not urinated in 6-8 hours.  You faint.  Your heart rate while sitting still is over 100 beats a minute.  You have trouble breathing. This information is not intended to replace advice given to you by your health care provider. Make sure you discuss any questions you have with your health care provider. Document Released: 11/28/2005 Document Revised: 06/24/2016 Document Reviewed: 01/22/2016 Elsevier Interactive Patient Education  2018 [employer].   Preventing Type 2 Diabetes Mellitus  YOU DO NOT HAVE DIABETES AT THIS TIME, but studies during your hospital stay suggest you are at risk for developing diabetes.    Type 2 diabetes (type 2 diabetes mellitus) is a long-term (chronic) disease that affects blood sugar (glucose) levels. Normally, a hormone called insulin allows glucose to enter cells in the body. The cells use glucose for energy. In type 2 diabetes, one or both of these problems may be present:  The body does not make enough insulin.  The body does not respond properly to insulin that it makes (insulin resistance).  Insulin resistance or lack of insulin causes excess glucose to build up in the blood instead of going into cells. As a result, high blood glucose (hyperglycemia) develops, which can cause many complications. Being overweight or obese and having an inactive (sedentary) lifestyle can increase your risk for diabetes. Type 2 diabetes can be delayed or prevented by making certain nutrition and lifestyle changes. What nutrition changes can be made?  Eat healthy meals and snacks regularly. Keep a healthy snack with you for when you get hungry between meals, such as fruit or a handful of nuts.  Eat lean meats and proteins that are low in saturated fats, such as chicken, fish, egg whites, and beans. Avoid processed meats.  Eat plenty of fruits and vegetables and  plenty of grains that have not been processed (whole grains). It is recommended that you eat: ? 1?2 cups of fruit every day. ? 2?3 cups of vegetables every day. ? 6?8 oz of whole grains every day, such as oats, whole wheat, bulgur, brown rice, quinoa, and millet.  Eat low-fat dairy products, such as milk, yogurt, and cheese.  Eat foods that contain healthy fats, such as nuts, avocado, olive oil, and canola oil.  Drink water throughout the day. Avoid drinks that contain added sugar, such as soda or sweet tea.  Follow instructions from your health care provider about specific eating or drinking restrictions.  Control how much food you eat at a time (portion size). ? Check food labels to find out the serving sizes of foods. ? Use a kitchen scale to weigh amounts of foods.  Saute or steam food instead of frying it. Cook with water or broth instead of oils or butter.  Limit your intake of: ? Salt (sodium). Have no more than 1 tsp (2,400 mg) of sodium a day. If you have heart disease or high blood pressure, have less than ? tsp (1,500 mg) of sodium a day. ? Saturated fat. This is fat that is solid at room temperature, such as butter or fat on meat. What lifestyle changes can be made?  Activity  Do moderate-intensity physical activity for at least 30 minutes on at least 5 days of the week, or as much as told by your health care provider.  Ask your health care provider what activities are safe for you. A mix of physical activities may be best, such as walking, swimming, cycling, and strength training.  Try to add physical activity into your day. For example: ? Park in spots that are farther away than usual, so that you walk more. For example, park in a far corner of the parking lot when you go to the office or the grocery store. ? Take a walk during your lunch break. ? Use stairs instead of elevators or escalators. Weight Loss  Lose weight as directed. Your health care provider can  determine how much weight loss is best for you and can help you lose weight safely.  If you are overweight or obese, you may be instructed to lose at least 5?7 % of your body weight. Alcohol and Tobacco   Limit alcohol intake to no more than 1 drink a day for nonpregnant women and 2 drinks a day for men. One drink equals 12 oz of beer, 5 oz of wine, or 1 oz of hard liquor.  Do not use any tobacco products, such as cigarettes, chewing tobacco, and e-cigarettes. If you need help quitting, ask your health care provider. Work With Agricultural consultant  Have your blood glucose tested regularly, as told by your health care provider.  Discuss your risk factors and how you can reduce your risk for diabetes.  Get screening tests as told by your health care provider. You may have screening tests regularly, especially if you have certain risk factors for type 2 diabetes.  Make an appointment with a diet and nutrition specialist (registered dietitian). A registered dietitian can help you make a healthy eating plan and can help you understand portion sizes and food labels. Why are these changes important?  It is possible to prevent or delay type 2 diabetes and related health problems by making lifestyle and nutrition changes.  It can be difficult to recognize signs of type 2 diabetes. The best way to avoid possible damage to your body is to take actions to prevent the disease before you develop symptoms. What can happen if changes are not made?  Your blood glucose levels may keep increasing. Having high blood glucose for a long time is dangerous. Too much glucose in your blood can damage your blood vessels, heart, kidneys, nerves, and eyes.  You may develop prediabetes or type 2 diabetes. Type 2 diabetes can lead to many chronic health problems and complications, such as: ? Heart disease. ? Stroke. ? Blindness. ? Kidney disease. ? Depression. ? Poor circulation in the feet and legs, which  could lead to surgical removal (amputation) in severe cases. Where to find support:  Ask your health care provider to recommend a registered dietitian, diabetes educator, or weight loss program.  Look for local or online weight loss groups.  Join a gym, fitness club, or outdoor activity group, such as a walking club. Where to find more information: To learn more about diabetes and diabetes prevention, visit:  American Diabetes Association (ADA): www.diabetes.[employer] of Diabetes and Digestive and Kidney Diseases: ToyArticles.ca  To learn more about healthy eating, visit:  The U.S. Department of Agriculture Architect), Choose My Plate: http://yates.biz/  Office of Disease Prevention and Health Promotion (ODPHP), Dietary Guidelines: ListingMagazine.si  Summary  You can reduce your risk for type 2 diabetes by increasing your physical activity, eating healthy foods, and losing weight as directed.  Talk with your health care provider about your risk for type 2 diabetes. Ask about any blood tests or screening tests that you need to have. This information is not intended to replace advice given to you by your health care provider. Make sure you discuss any questions you have with your health care provider. Document Released: 03/21/2016 Document Revised: 05/05/2016 Document Reviewed: 01/19/2016 Elsevier Interactive Patient Education  [employer].

## 2018-04-08 NOTE — Progress Notes (Signed)
Yolanda Graves to be D/C'd Home per MD order.  Discussed with the patient and all questions fully answered.  VSS, Skin clean, dry and intact without evidence of skin break down, no evidence of skin tears noted. IV catheter discontinued intact. Site without signs and symptoms of complications. Dressing and pressure applied.  An After Visit Summary was printed and given to the patient.   D/c education completed with patient/family including follow up instructions, medication list, d/c activities limitations if indicated, with other d/c instructions as indicated by MD - patient able to verbalize understanding, all questions fully answered.   Patient instructed to return to ED, call 911, or call MD for any changes in condition.   Patient escorted via WC, and D/C home via private auto.  Allergies as of 04/08/2018   No Known Allergies     Medication List    TAKE these medications   acetaminophen 325 MG tablet Commonly known as:  TYLENOL Take 650 mg by mouth every 6 (six) hours as needed for headache.   ALKA SELTZER PLUS PO Take 1 capsule by mouth daily as needed (congestion).   levothyroxine 112 MCG tablet Commonly known as:  SYNTHROID, LEVOTHROID Take 137 mcg by mouth daily before breakfast.   prochlorperazine 10 MG tablet Commonly known as:  COMPAZINE Take 1 tablet (10 mg total) by mouth 2 (two) times daily as needed for vomiting (headache).   vitamin B-12 1000 MCG tablet Commonly known as:  CYANOCOBALAMIN Take 1,000 mcg by mouth daily.      Yolanda Graves 04/08/2018 1:49 PM

## 2018-04-09 LAB — COXSACKIE A VIRUS ANTIBODIES
COXSACKIE A24 IGM: NEGATIVE {titer}
COXSACKIE A7 IGG: NEGATIVE {titer}
Coxsackie A16 IgG: 1:400 {titer} — ABNORMAL HIGH
Coxsackie A16 IgM: NEGATIVE titer
Coxsackie A7 IgM: NEGATIVE titer
Coxsackie A9 IgG: 1:400 {titer} — ABNORMAL HIGH
Coxsackie A9 IgM: NEGATIVE titer

## 2018-04-10 LAB — MULTIPLE MYELOMA PANEL, SERUM
ALBUMIN/GLOB SERPL: 1.2 (ref 0.7–1.7)
ALPHA2 GLOB SERPL ELPH-MCNC: 0.6 g/dL (ref 0.4–1.0)
Albumin SerPl Elph-Mcnc: 3.3 g/dL (ref 2.9–4.4)
Alpha 1: 0.2 g/dL (ref 0.0–0.4)
B-Globulin SerPl Elph-Mcnc: 1 g/dL (ref 0.7–1.3)
Gamma Glob SerPl Elph-Mcnc: 1 g/dL (ref 0.4–1.8)
Globulin, Total: 2.8 g/dL (ref 2.2–3.9)
IGA: 204 mg/dL (ref 87–352)
IGM (IMMUNOGLOBULIN M), SRM: 151 mg/dL (ref 26–217)
IgG (Immunoglobin G), Serum: 1105 mg/dL (ref 700–1600)
Total Protein ELP: 6.1 g/dL (ref 6.0–8.5)

## 2018-04-10 LAB — CULTURE, BLOOD (ROUTINE X 2)
CULTURE: NO GROWTH
Culture: NO GROWTH
SPECIAL REQUESTS: ADEQUATE

## 2018-04-10 LAB — CYCLIC CITRUL PEPTIDE ANTIBODY, IGG/IGA: CCP Antibodies IgG/IgA: 5 units (ref 0–19)

## 2018-04-11 LAB — COXSACKIE B VIRUS ANTIBODIES
COXSACKIE B1 AB: NEGATIVE
COXSACKIE B2 AB: NEGATIVE
COXSACKIE B3 AB: NEGATIVE
COXSACKIE B5 AB: NEGATIVE
Coxsackie B4 Ab: NEGATIVE
Coxsackie B6 Ab: NEGATIVE

## 2018-04-11 LAB — QUANTIFERON-TB GOLD PLUS: QuantiFERON-TB Gold Plus: NEGATIVE

## 2018-04-11 LAB — QUANTIFERON-TB GOLD PLUS (RQFGPL)
QUANTIFERON NIL VALUE: 0.14 [IU]/mL
QUANTIFERON TB1 AG VALUE: 0.36 [IU]/mL
QUANTIFERON TB2 AG VALUE: 0.23 [IU]/mL

## 2019-02-05 IMAGING — CT CT ABD-PELV W/O CM
2 of 4 series · 16 of 46 positions shown, 18 images · non-contrast
Comparison: None.

CLINICAL DATA: Fever and chest pain. Nausea and vomiting. Elevated
white cell count.

EXAM:
CT ABDOMEN AND PELVIS WITHOUT CONTRAST
TECHNIQUE: Multidetector CT imaging of the abdomen and pelvis was performed
following the standard protocol without IV contrast.

[Series 3: ap without · axial · non-contrast · 0.91mm/px · z∈[+947,+1347]mm · 13 of 90 slices shown, 15 images]
[im 5/90  soft-tissue]
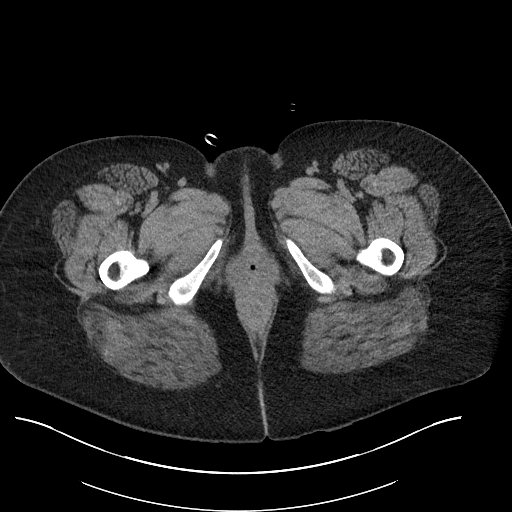
[im 5/90  bone]
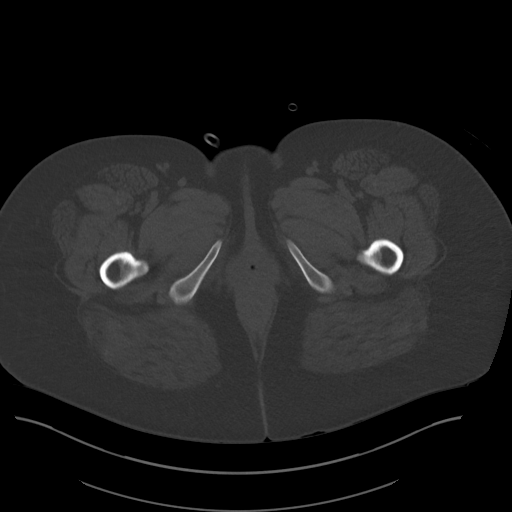
[im 14/90  soft-tissue]
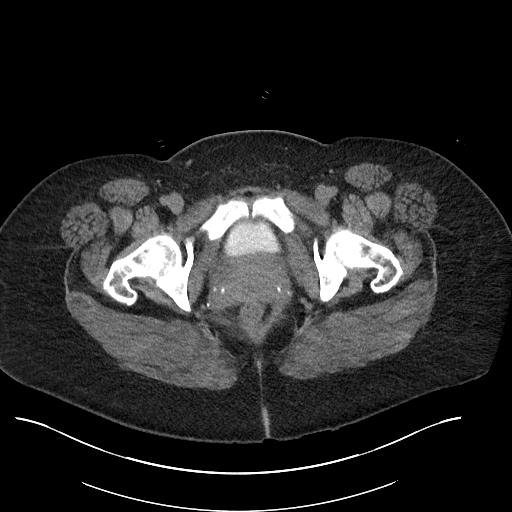
[im 18/90  soft-tissue]
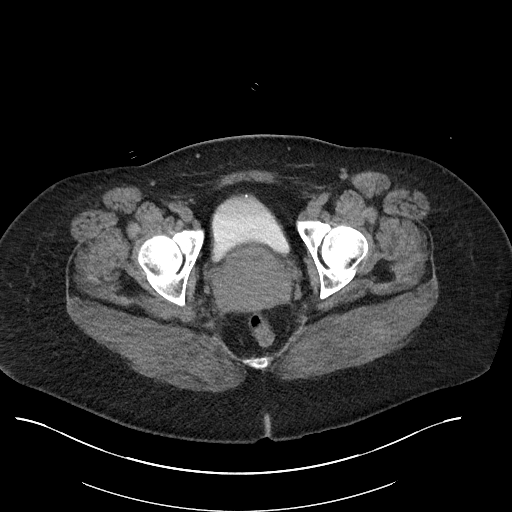
[im 27/90  soft-tissue]
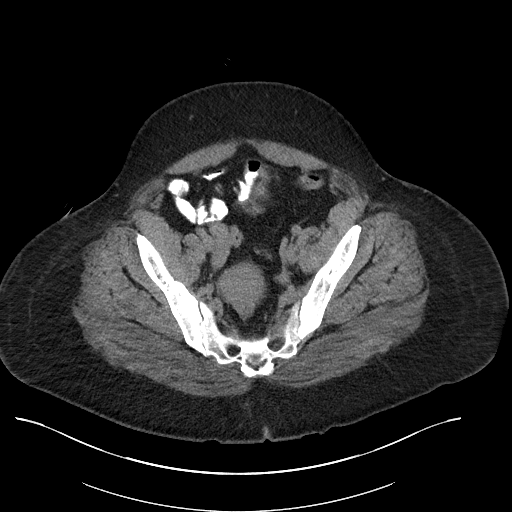
[im 32/90  soft-tissue]
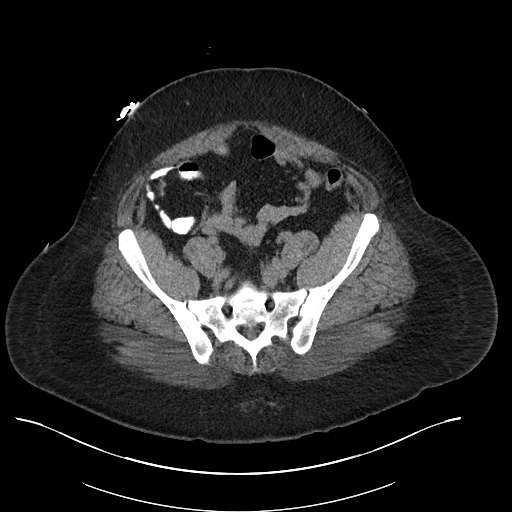
[im 41/90  soft-tissue]
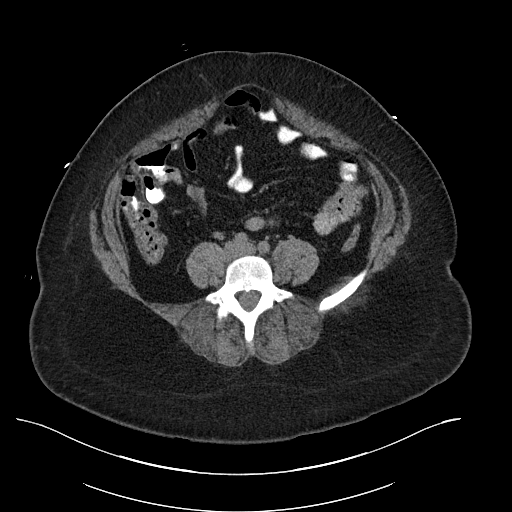
[im 45/90  soft-tissue]
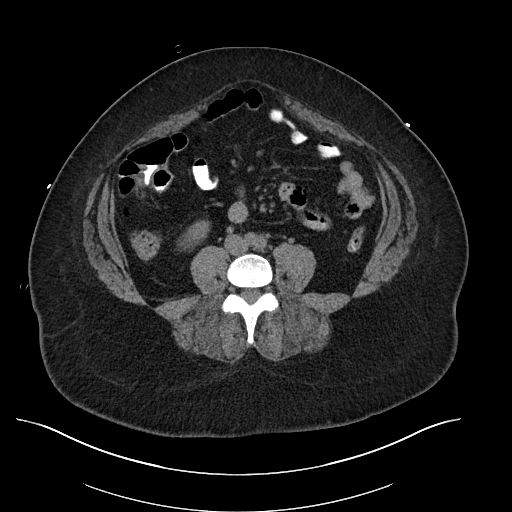
[im 49/90  soft-tissue]
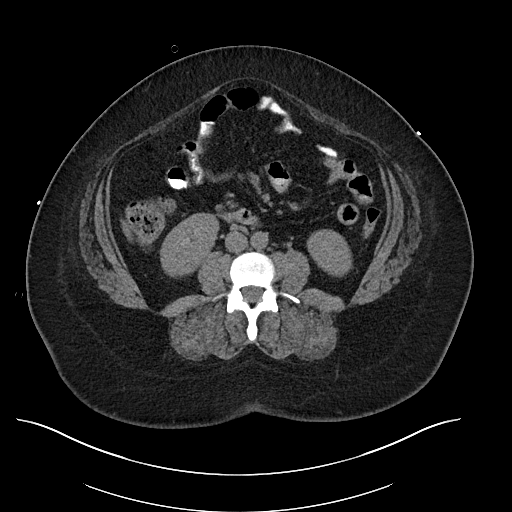
[im 58/90  soft-tissue]
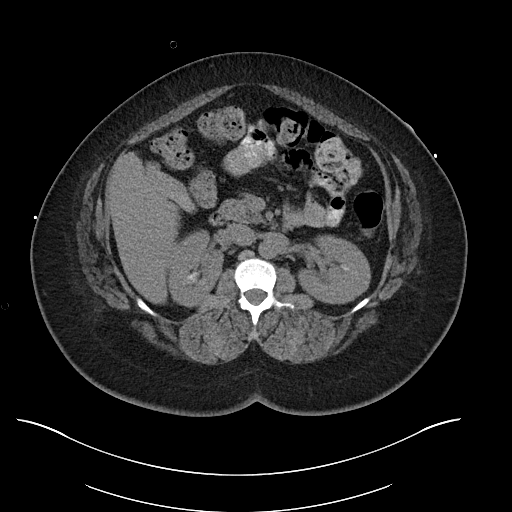
[im 58/90  bone]
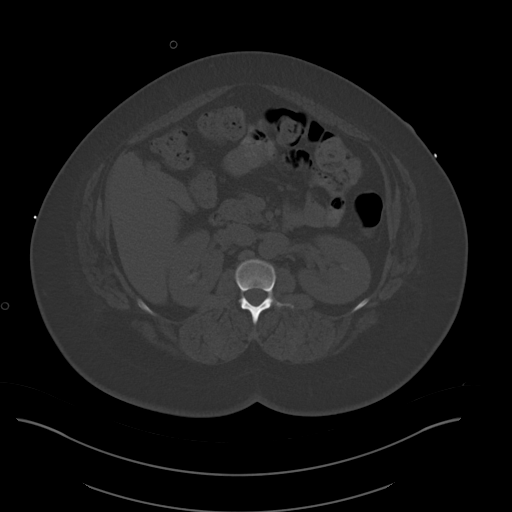
[im 63/90  soft-tissue]
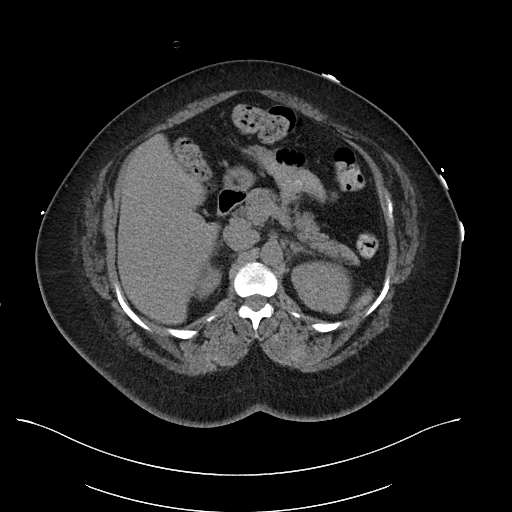
[im 72/90  soft-tissue]
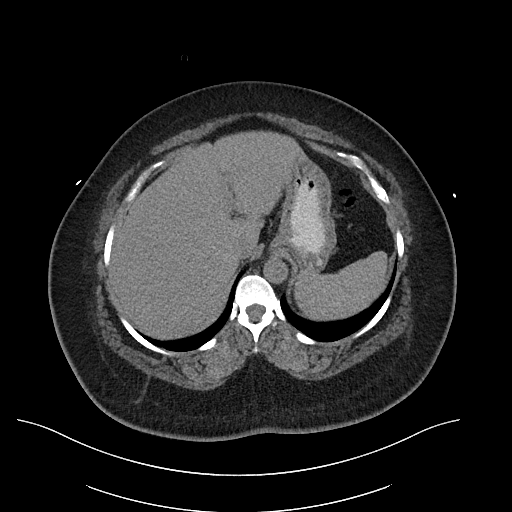
[im 76/90  soft-tissue]
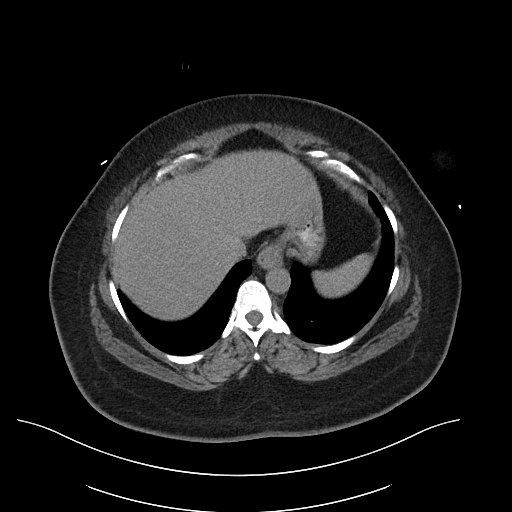
[im 85/90  soft-tissue]
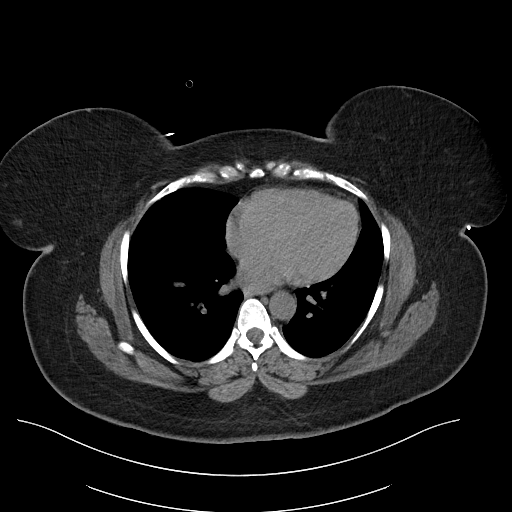

[Series 6: cor · coronal · 0.80mm/px · 3 of 101 slices shown]
[im 34/101  soft-tissue]
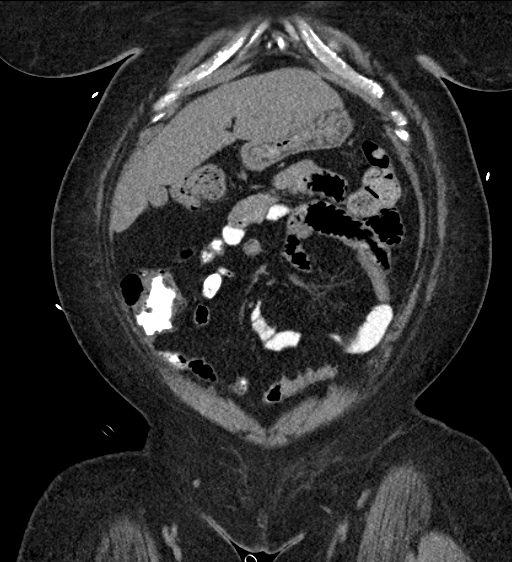
[im 45/101  soft-tissue]
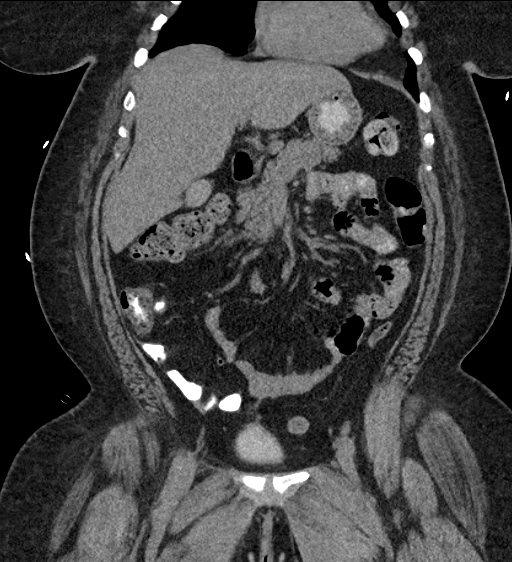
[im 56/101  soft-tissue]
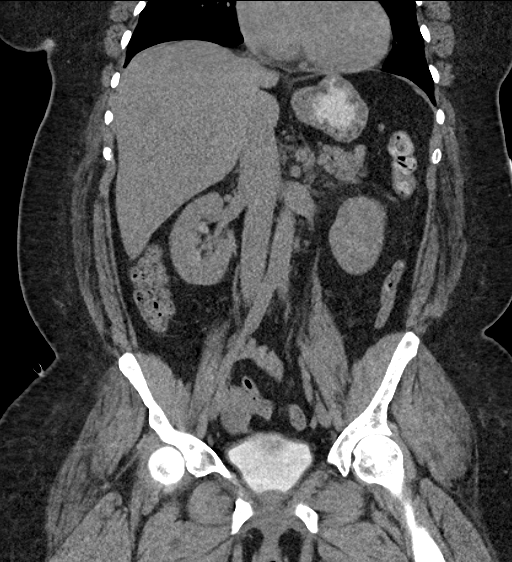

[16 of 46 positions shown; findings below may reference images not displayed]

FINDINGS: Lower chest: Lung bases are clear.

Hepatobiliary: Increased density of the gallbladder likely
representing sludge or vicarious contrast excretion. No discrete
stones identified. Gallbladder is contracted. No bile duct
dilatation. No focal liver lesions.

Pancreas: Unremarkable. No pancreatic ductal dilatation or
surrounding inflammatory changes.

Spleen: Normal in size without focal abnormality.

Adrenals/Urinary Tract: Residual contrast material in the bladder
and renal collecting systems. No hydronephrosis or hydroureter
ureter. No renal or ureteral stones. No bladder wall thickening.

Stomach/Bowel: Stomach is within normal limits. Appendix appears
normal. No evidence of bowel wall thickening, distention, or
inflammatory changes.

Vascular/Lymphatic: No significant vascular findings are present. No
enlarged abdominal or pelvic lymph nodes.

Reproductive: Uterus and bilateral adnexa are unremarkable.

Other: Small periumbilical hernia containing fat. No free air or
free fluid in the abdomen.

Musculoskeletal: No acute or significant osseous findings.
IMPRESSION: 1. No evidence of bowel obstruction or inflammation.
2. Increased density in the gallbladder may represent sludge or
vicarious contrast excretion.
3. Residual contrast material in the bladder and renal collecting
systems. No hydronephrosis.
4. Small periumbilical hernia containing fat.

## 2020-06-09 ENCOUNTER — Encounter (HOSPITAL_COMMUNITY): Payer: Self-pay

## 2020-06-09 ENCOUNTER — Emergency Department (HOSPITAL_COMMUNITY)
Admission: EM | Admit: 2020-06-09 | Discharge: 2020-06-10 | Disposition: A | Discharge status: Home / Self Care | Payer: 59 | Attending: Emergency Medicine | Admitting: Emergency Medicine

## 2020-06-09 DIAGNOSIS — Z79899 Other long term (current) drug therapy: Secondary | ICD-10-CM | POA: Diagnosis not present

## 2020-06-09 DIAGNOSIS — R42 Dizziness and giddiness: Secondary | ICD-10-CM | POA: Diagnosis not present

## 2020-06-09 DIAGNOSIS — G44209 Tension-type headache, unspecified, not intractable: Secondary | ICD-10-CM | POA: Diagnosis not present

## 2020-06-09 DIAGNOSIS — R519 Headache, unspecified: Secondary | ICD-10-CM

## 2020-06-09 LAB — BASIC METABOLIC PANEL
Anion gap: 10 (ref 5–15)
BUN: 7 mg/dL (ref 6–20)
CO2: 24 mmol/L (ref 22–32)
Calcium: 9.1 mg/dL (ref 8.9–10.3)
Chloride: 104 mmol/L (ref 98–111)
Creatinine, Ser: 0.72 mg/dL (ref 0.44–1.00)
GFR calc Af Amer: >60 mL/min (ref >60)
GFR calc non Af Amer: >60 mL/min (ref >60)
Glucose, Bld: 118 mg/dL — ABNORMAL HIGH (ref 70–99)
Potassium: 3.9 mmol/L (ref 3.5–5.1)
Sodium: 138 mmol/L (ref 135–145)

## 2020-06-09 LAB — CBC
HCT: 39.4 % (ref 36.0–46.0)
Hemoglobin: 11.9 g/dL — ABNORMAL LOW (ref 12.0–15.0)
MCH: 24.8 pg — ABNORMAL LOW (ref 26.0–34.0)
MCHC: 30.2 g/dL (ref 30.0–36.0)
MCV: 82.1 fL (ref 80.0–100.0)
Platelets: 268 10*3/uL (ref 150–400)
RBC: 4.8 MIL/uL (ref 3.87–5.11)
RDW: 15.5 % (ref 11.5–15.5)
WBC: 6.8 10*3/uL (ref 4.0–10.5)
nRBC: 0 % (ref 0.0–0.2)

## 2020-06-09 LAB — I-STAT BETA HCG BLOOD, ED (MC, WL, AP ONLY): I-stat hCG, quantitative: <5 m[IU]/mL (ref <5)

## 2020-06-09 MED ORDER — SODIUM CHLORIDE 0.9% FLUSH: 3.0000 mL | Freq: Once | INTRAVENOUS | Status: DC

## 2020-06-09 NOTE — ED Triage Notes (Signed)
Pt states that she has been having dizziness, room spinning, since this afternoon with nausea

## 2020-06-10 ENCOUNTER — Emergency Department (HOSPITAL_COMMUNITY): Payer: 59

## 2020-06-10 ENCOUNTER — Other Ambulatory Visit: Payer: Self-pay

## 2020-06-10 MED ORDER — PROCHLORPERAZINE EDISYLATE 10 MG/2ML IJ SOLN
10.0000 mg | Freq: Once | INTRAMUSCULAR | Status: AC
  Administered 2020-06-10: 10 mg via INTRAVENOUS
  Filled 2020-06-10: qty 2

## 2020-06-10 MED ORDER — SODIUM CHLORIDE 0.9 % IV BOLUS
1000.0000 mL | Freq: Once | INTRAVENOUS | Status: AC
  Administered 2020-06-10: 1000 mL via INTRAVENOUS

## 2020-06-10 MED ORDER — DIPHENHYDRAMINE HCL 50 MG/ML IJ SOLN
25.0000 mg | Freq: Once | INTRAMUSCULAR | Status: AC
  Administered 2020-06-10: 25 mg via INTRAVENOUS
  Filled 2020-06-10: qty 1

## 2020-06-10 MED ORDER — MECLIZINE HCL 25 MG PO TABS
25.0000 mg | ORAL_TABLET | Freq: Once | ORAL | Status: AC
  Administered 2020-06-10: 25 mg via ORAL
  Filled 2020-06-10: qty 1

## 2020-06-10 MED ORDER — MECLIZINE HCL 25 MG PO TABS: 25.0000 mg | ORAL_TABLET | Freq: Three times a day (TID) | ORAL | 0 refills | Status: AC | PRN

## 2020-06-10 MED ORDER — IOHEXOL 350 MG/ML SOLN
75.0000 mL | Freq: Once | INTRAVENOUS | Status: AC | PRN
  Administered 2020-06-10: 75 mL via INTRAVENOUS

## 2020-06-10 NOTE — ED Notes (Addendum)
IV attempted x 2 without success.  States she is difficult IV stick.

## 2020-06-10 NOTE — ED Notes (Signed)
Discharge instructions discussed with pt. Pt verbalized understanding with no questions at this time. Pt to go home with spouse at bedside °

## 2020-06-10 NOTE — ED Provider Notes (Signed)
[facility] EMERGENCY DEPARTMENT Provider Note   CSN: [id number] Arrival date & time: 06/09/20  2022     History Chief Complaint  Patient presents with   Dizziness    Yolanda Graves is a 39 y.o. female.  HPI      Presents with concern for headache and dizziness Headache took about an hour before it peaked, 10/10--8/10 No head trauma Dizziness is room spinning, worse with head movements, better with sitting still, better with closing eyes Has had migraines but this is worse Headache not worse with bright lights or loud sounds Headache also worse with movement Mom had hx of tumor, not sure if aunt had something else, maybe aneurysm  Denies numbness, weakness, difficulty talking or walking, visual changes or facial droop.   Past Medical History:  Diagnosis Date   Thyroid disease    hpyothyroid    Patient Active Problem List   Diagnosis Date Noted   Sepsis [facility])     Past Surgical History:  Procedure Laterality Date   TUBAL LIGATION       OB History   No obstetric history on file.     No family history on file.  Social History   Tobacco Use   Smoking status: Never Smoker   Smokeless tobacco: Never Used  Substance Use Topics   Alcohol use: No   Drug use: No    Home Medications Prior to Admission medications   Medication Sig Start Date End Date Taking? Authorizing Provider  acetaminophen (TYLENOL) 325 MG tablet Take 650 mg by mouth every 6 (six) hours as needed for headache.    [provider]  levothyroxine (SYNTHROID, LEVOTHROID) 112 MCG tablet Take 137 mcg by mouth daily before breakfast.     [provider]  meclizine (ANTIVERT) 25 MG tablet Take 1 tablet (25 mg total) by mouth 3 (three) times daily as needed for dizziness. 06/10/20   Alvira Monday, MD  Phenyleph-Doxylamine-DM-APAP (ALKA SELTZER PLUS PO) Take 1 capsule by mouth daily as needed (congestion).    [provider]  prochlorperazine  (COMPAZINE) 10 MG tablet Take 1 tablet (10 mg total) by mouth 2 (two) times daily as needed for vomiting (headache). 01/15/16   Tomasita Crumble, MD  vitamin B-12 (CYANOCOBALAMIN) 1000 MCG tablet Take 1,000 mcg by mouth daily.    [provider]    Allergies    Patient has no known allergies.  Review of Systems   Review of Systems  Constitutional: Negative for fever.  HENT: Negative for congestion and sore throat.   Eyes: Negative for visual disturbance.  Respiratory: Negative for cough and shortness of breath.   Cardiovascular: Negative for chest pain.  Gastrointestinal: Positive for nausea. Negative for abdominal pain.  Genitourinary: Negative for difficulty urinating and dysuria.  Musculoskeletal: Negative for neck pain.  Skin: Negative for rash.  Neurological: Positive for dizziness and headaches. Negative for seizures, syncope, facial asymmetry, speech difficulty, weakness and numbness.    Physical Exam Updated Vital Signs BP 109/66    Pulse 85    Temp 98 F (36.7 C) (Oral)    Resp 15    LMP 05/26/2020    SpO2 100%   Physical Exam Constitutional:      General: She is not in acute distress.    Appearance: Normal appearance. She is not ill-appearing.  HENT:     Head: Normocephalic and atraumatic.  Eyes:     General: No visual field deficit.    Extraocular Movements: Extraocular movements intact.  Conjunctiva/sclera: Conjunctivae normal.     Pupils: Pupils are equal, round, and reactive to light.  Cardiovascular:     Rate and Rhythm: Normal rate and regular rhythm.     Pulses: Normal pulses.  Pulmonary:     Effort: Pulmonary effort is normal. No respiratory distress.  Musculoskeletal:        General: No swelling or tenderness.     Cervical back: Normal range of motion.  Skin:    General: Skin is warm and dry.     Findings: No erythema or rash.  Neurological:     General: No focal deficit present.     Mental Status: She is alert and oriented to person, place,  and time.     GCS: GCS eye subscore is 4. GCS verbal subscore is 5. GCS motor subscore is 6.     Cranial Nerves: No cranial nerve deficit, dysarthria or facial asymmetry.     Sensory: No sensory deficit.     Motor: No weakness or tremor.     Coordination: Coordination normal. Finger-Nose-Finger Test normal.     Gait: Gait normal.     ED Results / Procedures / Treatments   Labs (all labs ordered are listed, but only abnormal results are displayed) Labs Reviewed  BASIC METABOLIC PANEL - Abnormal; Notable for the following components:      Result Value   Glucose, Bld 118 (*)    All other components within normal limits  CBC - Abnormal; Notable for the following components:   Hemoglobin 11.9 (*)    MCH 24.8 (*)    All other components within normal limits  URINALYSIS, ROUTINE W REFLEX MICROSCOPIC  I-STAT BETA HCG BLOOD, ED ([facility], [facility], [facility] ONLY)    EKG EKG Interpretation  Date/Time:  Tuesday June 09 2020 20:39:39 EDT Ventricular Rate:  85 PR Interval:  180 QRS Duration: 100 QT Interval:  384 QTC Calculation: 456 R Axis:   47 Text Interpretation: Normal sinus rhythm Incomplete right bundle branch block Nonspecific T wave abnormality Abnormal ECG No significant change since last tracing Confirmed by Alvira Monday ([id number]) on 06/10/2020 8:06:24 AM   Radiology CT Angio Head W or Wo Contrast  Result Date: 06/10/2020 CLINICAL DATA:  Severe headache, vertigo; dizziness, nonspecific. EXAM: CT ANGIOGRAPHY HEAD AND NECK TECHNIQUE: Multidetector CT imaging of the head and neck was performed using the standard protocol during bolus administration of intravenous contrast. Multiplanar CT image reconstructions and MIPs were obtained to evaluate the vascular anatomy. Carotid stenosis measurements (when applicable) are obtained utilizing NASCET criteria, using the distal internal carotid diameter as the denominator. CONTRAST:  70mL OMNIPAQUE IOHEXOL 350 MG/ML SOLN COMPARISON:  Head CT 09/03/2011  FINDINGS: CT HEAD FINDINGS Brain: Cerebral volume is normal for age. Similar to prior head CT 09/03/2011, there are a few scattered punctate parenchymal calcifications, most notably within the right parietal lobe near the gray-white junction (series 14, image 14). There is no acute intracranial hemorrhage. No demarcated cortical infarct. No extra-axial fluid collection. No evidence of intracranial mass. No midline shift. Partially empty sella turcica. Vascular: Reported below Skull: Normal. Negative for fracture or focal lesion. Sinuses: Tiny right maxillary sinus mucous retention cyst. No significant mastoid effusion. Orbits: No acute abnormality. Review of the MIP images confirms the above findings CTA NECK FINDINGS Aortic arch: Standard aortic branching. The visualized aortic arch is unremarkable. No hemodynamically significant innominate or proximal subclavian artery stenosis. Right carotid system: CCA and ICA smooth and patent within the neck without stenosis. No significant  atherosclerotic disease. Left carotid system: CCA and ICA smooth and patent within the neck without stenosis. No significant atherosclerotic disease. Vertebral arteries: The vertebral arteries are codominant and patent within the neck without stenosis. Skeleton: No acute bony abnormality or aggressive osseous lesion. Other neck: No neck mass or cervical lymphadenopathy. The thyroid gland is atrophic or surgically absent. Upper chest: No consolidation within the imaged lung apices. Review of the MIP images confirms the above findings CTA HEAD FINDINGS Anterior circulation: The intracranial internal carotid arteries are patent without hemodynamically significant stenosis. The M1 middle cerebral arteries are patent without significant stenosis. No M2 proximal branch occlusion or high-grade proximal stenosis is identified. The anterior cerebral arteries are patent without significant proximal stenosis. No intracranial aneurysm is identified.  Posterior circulation: The intracranial vertebral arteries are patent without significant stenosis, as is the basilar artery. The posterior cerebral arteries are patent bilaterally. Apparent moderate stenosis within a left P2/P3 junction branch vessel (series 13, image 24). Posterior communicating arteries are hypoplastic or absent bilaterally. Venous sinuses: Within limitations of contrast timing, no convincing thrombus. Anatomic variants: As described Review of the MIP images confirms the above findings IMPRESSION: CT head: 1. No evidence of acute intracranial abnormality. 2. Redemonstrated partially empty sella turcica. This is very commonly an incidental finding, but can be associated with idiopathic intracranial hypertension. 3. There are few scattered punctate parenchymal calcifications within the brain which are nonspecific but appear unchanged as compared to prior head CT 09/03/2011. CTA neck: 1. The common carotid, internal carotid and vertebral arteries are patent within the neck without hemodynamically significant stenosis. 2. Atrophic or surgically absent thyroid gland. CTA head: 1. No intracranial large vessel occlusion or proximal high-grade arterial stenosis. 2. Moderate focal stenosis within a left PCA P2/P3 junction branch vessel. Electronically Signed   By: Jackey Loge DO   On: 06/10/2020 15:58   CT Angio Neck W and/or Wo Contrast  Result Date: 06/10/2020 CLINICAL DATA:  Severe headache, vertigo; dizziness, nonspecific. EXAM: CT ANGIOGRAPHY HEAD AND NECK TECHNIQUE: Multidetector CT imaging of the head and neck was performed using the standard protocol during bolus administration of intravenous contrast. Multiplanar CT image reconstructions and MIPs were obtained to evaluate the vascular anatomy. Carotid stenosis measurements (when applicable) are obtained utilizing NASCET criteria, using the distal internal carotid diameter as the denominator. CONTRAST:  26mL OMNIPAQUE IOHEXOL 350 MG/ML SOLN  COMPARISON:  Head CT 09/03/2011 FINDINGS: CT HEAD FINDINGS Brain: Cerebral volume is normal for age. Similar to prior head CT 09/03/2011, there are a few scattered punctate parenchymal calcifications, most notably within the right parietal lobe near the gray-white junction (series 14, image 14). There is no acute intracranial hemorrhage. No demarcated cortical infarct. No extra-axial fluid collection. No evidence of intracranial mass. No midline shift. Partially empty sella turcica. Vascular: Reported below Skull: Normal. Negative for fracture or focal lesion. Sinuses: Tiny right maxillary sinus mucous retention cyst. No significant mastoid effusion. Orbits: No acute abnormality. Review of the MIP images confirms the above findings CTA NECK FINDINGS Aortic arch: Standard aortic branching. The visualized aortic arch is unremarkable. No hemodynamically significant innominate or proximal subclavian artery stenosis. Right carotid system: CCA and ICA smooth and patent within the neck without stenosis. No significant atherosclerotic disease. Left carotid system: CCA and ICA smooth and patent within the neck without stenosis. No significant atherosclerotic disease. Vertebral arteries: The vertebral arteries are codominant and patent within the neck without stenosis. Skeleton: No acute bony abnormality or aggressive osseous lesion. Other neck: No  neck mass or cervical lymphadenopathy. The thyroid gland is atrophic or surgically absent. Upper chest: No consolidation within the imaged lung apices. Review of the MIP images confirms the above findings CTA HEAD FINDINGS Anterior circulation: The intracranial internal carotid arteries are patent without hemodynamically significant stenosis. The M1 middle cerebral arteries are patent without significant stenosis. No M2 proximal branch occlusion or high-grade proximal stenosis is identified. The anterior cerebral arteries are patent without significant proximal stenosis. No  intracranial aneurysm is identified. Posterior circulation: The intracranial vertebral arteries are patent without significant stenosis, as is the basilar artery. The posterior cerebral arteries are patent bilaterally. Apparent moderate stenosis within a left P2/P3 junction branch vessel (series 13, image 24). Posterior communicating arteries are hypoplastic or absent bilaterally. Venous sinuses: Within limitations of contrast timing, no convincing thrombus. Anatomic variants: As described Review of the MIP images confirms the above findings IMPRESSION: CT head: 1. No evidence of acute intracranial abnormality. 2. Redemonstrated partially empty sella turcica. This is very commonly an incidental finding, but can be associated with idiopathic intracranial hypertension. 3. There are few scattered punctate parenchymal calcifications within the brain which are nonspecific but appear unchanged as compared to prior head CT 09/03/2011. CTA neck: 1. The common carotid, internal carotid and vertebral arteries are patent within the neck without hemodynamically significant stenosis. 2. Atrophic or surgically absent thyroid gland. CTA head: 1. No intracranial large vessel occlusion or proximal high-grade arterial stenosis. 2. Moderate focal stenosis within a left PCA P2/P3 junction branch vessel. Electronically Signed   By: Jackey LogeKyle  Golden DO   On: 06/10/2020 15:58    Procedures Procedures (including critical care time)  Medications Ordered in ED Medications  sodium chloride flush (NS) 0.9 % injection 3 mL (has no administration in time range)  meclizine (ANTIVERT) tablet 25 mg (25 mg Oral Given 06/10/20 0844)  sodium chloride 0.9 % bolus 1,000 mL (0 mLs Intravenous Stopped 06/10/20 1255)  prochlorperazine (COMPAZINE) injection 10 mg (10 mg Intravenous Given 06/10/20 1030)  diphenhydrAMINE (BENADRYL) injection 25 mg (25 mg Intravenous Given 06/10/20 1030)  iohexol (OMNIPAQUE) 350 MG/ML injection 75 mL (75 mLs Intravenous  Contrast Given 06/10/20 1518)    ED Course  I have reviewed the triage vital signs and the nursing notes.  Pertinent labs & imaging results that were available during my care of the patient were reviewed by me and considered in my medical decision making (see chart for details).    MDM Rules/Calculators/A&P                          39yo female with history of hypothyroidism presents with concern for headache and dizziness.  Labs show no significant electrolyte abnormalities or anemia to explain her dizziness.  EKG shows sinus rhythm without other acute abnormalities.  CTA head and neck ordered which showed no acute findings, no aneurysm or dissection.  Do not feel LP indicated in setting of normal CTA, lower suspicion for [facility].   Given Compazine, Benadryl, meclizine for dizziness with improvement. Suspect peripheral cause of vertigo versus possible migraine. Recommend PCP follow up . Patient discharged in stable condition with understanding of reasons to return.     Final Clinical Impression(s) / ED Diagnoses Final diagnoses:  Dizziness  Acute nonintractable headache, unspecified headache type    Rx / DC Orders ED Discharge Orders         Ordered    meclizine (ANTIVERT) 25 MG tablet  3 times daily PRN  Discontinue  Reprint     06/10/20 1653           Alvira Monday, MD 06/10/20 2158

## 2020-06-10 NOTE — ED Notes (Addendum)
Transported to CT 

## 2020-06-10 NOTE — ED Notes (Signed)
Patient transported to CT 

## 2020-06-10 NOTE — ED Notes (Signed)
IV team at bedside 

## 2020-06-10 NOTE — Discharge Instructions (Addendum)
Thank you for seeing [place] today. We again apologize for your prolonged stay in the emergency department but are happy you are feeling better. Your CT scan did not show sign of bleeding or aneurysm.  We recommend following up with your doctor and taking meclizine for dizziness.  Your CT showed the following per Radiology:   1. No evidence of acute intracranial abnormality. 2. Redemonstrated partially empty sella turcica. This is very commonly an incidental finding, but can be associated with idiopathic intracranial hypertension. 3. There are few scattered punctate parenchymal calcifications within the brain which are nonspecific but appear unchanged as compared to prior head CT 09/03/2011.

## 2022-10-30 ENCOUNTER — Encounter (HOSPITAL_COMMUNITY): Payer: Self-pay | Admitting: Emergency Medicine

## 2022-10-30 ENCOUNTER — Emergency Department (HOSPITAL_COMMUNITY)
Admission: EM | Admit: 2022-10-30 | Discharge: 2022-10-31 | Disposition: A | Discharge status: Home / Self Care | Payer: 59 | Attending: Emergency Medicine | Admitting: Emergency Medicine

## 2022-10-30 ENCOUNTER — Other Ambulatory Visit: Payer: Self-pay

## 2022-10-30 DIAGNOSIS — Z79899 Other long term (current) drug therapy: Secondary | ICD-10-CM | POA: Diagnosis not present

## 2022-10-30 DIAGNOSIS — E119 Type 2 diabetes mellitus without complications: Secondary | ICD-10-CM | POA: Insufficient documentation

## 2022-10-30 DIAGNOSIS — E039 Hypothyroidism, unspecified: Secondary | ICD-10-CM | POA: Diagnosis not present

## 2022-10-30 DIAGNOSIS — T7840XA Allergy, unspecified, initial encounter: Secondary | ICD-10-CM | POA: Insufficient documentation

## 2022-10-30 MED ORDER — FAMOTIDINE IN NACL 20-0.9 MG/50ML-% IV SOLN
20.0000 mg | Freq: Once | INTRAVENOUS | Status: AC
  Administered 2022-10-30: 20 mg via INTRAVENOUS
  Filled 2022-10-30: qty 50

## 2022-10-30 MED ORDER — METHYLPREDNISOLONE SODIUM SUCC 125 MG IJ SOLR
125.0000 mg | Freq: Once | INTRAMUSCULAR | Status: AC
  Administered 2022-10-30: 125 mg via INTRAVENOUS
  Filled 2022-10-30: qty 2

## 2022-10-30 NOTE — ED Provider Triage Note (Signed)
Emergency Medicine Provider Triage Evaluation Note  Yolanda Graves , a 41 y.o. female  was evaluated in triage.  Pt complains of concerns for allergic reaction onset prior to arrival.  Notes that she woke up and felt swelling to her bilateral hands as well as itching to her throat.  Denies any known allergens.  Took 2 over-the-counter Benadryl's at 10 PM prior to arrival.  No that she ate shrimp and broccoli for dinner this evening.  Denies chest pain, shortness of breath, nausea, vomiting, rash.  Review of Systems  Positive: Negative:   Physical Exam  BP (!) 158/109   Pulse 91   Temp 98.6 F (37 C)   Resp 19   SpO2 94%  Gen:   Awake, no distress   Resp:  Normal effort  MSK:   Moves extremities without difficulty  Other:  No appreciable hives noted to torso, bilateral arms, lower legs.  No appreciable hives noted to bilateral hands.  No appreciable swelling noted to bilateral hands.  Medical Decision Making  Medically screening exam initiated at 10:26 PM.  Appropriate orders placed.  Danea Coldren was informed that the remainder of the evaluation will be completed by another provider, this initial triage assessment does not replace that evaluation, and the importance of remaining in the ED until their evaluation is complete.  10:27 PM - Discussed with RN that patient is in need of a room. RN aware and working on room placement.    Elfego Giammarino A, PA-C 10/30/22 2227

## 2022-10-30 NOTE — ED Provider Notes (Signed)
[facility] EMERGENCY DEPARTMENT Provider Note   CSN: [id number] Arrival date & time: 10/30/22  2218     History {Add pertinent medical, surgical, social history, OB history to HPI:1} Chief Complaint  Patient presents with   Allergic Reaction    Yolanda Graves is a 41 y.o. female.  HPI   Medical history including prediabetes, hypothyroidism, presents emerged for complaints of allergic reaction.  Patient states that she woke up from sleep feeling as if she was having itching like sensation in her fingers and arms, she then noticed that she is having some scratchy sensation in her throat, she denies any tongue throat lip swelling difficulty breathing, no shortness of breath, no stomach pains no nausea or vomiting, no systemic rash.  She states that she has never had this in the past, no new environmental changes, no new medications, she states that she took 2 Benadryl's prior to arrival.    Home Medications Prior to Admission medications   Medication Sig Start Date End Date Taking? Authorizing Provider  acetaminophen (TYLENOL) 325 MG tablet Take 650 mg by mouth every 6 (six) hours as needed for headache.    [provider]  levothyroxine (SYNTHROID, LEVOTHROID) 112 MCG tablet Take 137 mcg by mouth daily before breakfast.     [provider]  meclizine (ANTIVERT) 25 MG tablet Take 1 tablet (25 mg total) by mouth 3 (three) times daily as needed for dizziness. 06/10/20   Alvira Monday, MD  Phenyleph-Doxylamine-DM-APAP (ALKA SELTZER PLUS PO) Take 1 capsule by mouth daily as needed (congestion).    [provider]  prochlorperazine (COMPAZINE) 10 MG tablet Take 1 tablet (10 mg total) by mouth 2 (two) times daily as needed for vomiting (headache). 01/15/16   Tomasita Crumble, MD  vitamin B-12 (CYANOCOBALAMIN) 1000 MCG tablet Take 1,000 mcg by mouth daily.    [provider]      Allergies    Patient has no known allergies.    Review of  Systems   Review of Systems  Constitutional:  Negative for chills and fever.  Respiratory:  Negative for shortness of breath.   Cardiovascular:  Negative for chest pain.  Gastrointestinal:  Negative for abdominal pain.  Skin:  Positive for rash.  Neurological:  Negative for headaches.    Physical Exam Updated Vital Signs BP (!) 158/109   Pulse 79   Temp 98.1 F (36.7 C) (Oral)   Resp 19   SpO2 94%  Physical Exam Vitals and nursing note reviewed.  Constitutional:      General: She is not in acute distress.    Appearance: She is not ill-appearing.  HENT:     Head: Normocephalic and atraumatic.     Nose: No congestion.     Mouth/Throat:     Mouth: Mucous membranes are moist.     Pharynx: Oropharynx is clear.     Comments: No facial or oral edema present, tongue uvula both midline, controlling her secretions, tonsils both equal symmetric bilaterally, no submandibular swelling, no muffled voice or hot potato voice. Eyes:     Conjunctiva/sclera: Conjunctivae normal.  Cardiovascular:     Rate and Rhythm: Normal rate and regular rhythm.     Pulses: Normal pulses.     Heart sounds: No murmur heard.    No friction rub. No gallop.  Pulmonary:     Effort: No respiratory distress.     Breath sounds: No wheezing, rhonchi or rales.  Skin:    General: Skin is warm  and dry.     Comments: No notable rash on patient's upper and or lower extremities abdomen or back.  Neurological:     Mental Status: She is alert.  Psychiatric:        Mood and Affect: Mood normal.     ED Results / Procedures / Treatments   Labs (all labs ordered are listed, but only abnormal results are displayed) Labs Reviewed - No data to display  EKG None  Radiology No results found.  Procedures Procedures  {Document cardiac monitor, telemetry assessment procedure when appropriate:1}  Medications Ordered in ED Medications  famotidine (PEPCID) IVPB 20 mg premix (has no administration in time range)   methylPREDNISolone sodium succinate (SOLU-MEDROL) 125 mg/2 mL injection 125 mg (has no administration in time range)    ED Course/ Medical Decision Making/ A&P                           Medical Decision Making Risk Prescription drug management.   This patient presents to the ED for concern of allergic reaction, this involves an extensive number of treatment options, and is a complaint that carries with it a high risk of complications and morbidity.  The differential diagnosis includes anaphylaxis, angioedema    Additional history obtained:  Additional history obtained from N/A External records from outside source obtained and reviewed including endocrinology notes   Co morbidities that complicate the patient evaluation  Prediabetes  Social Determinants of Health:  N/A    Lab Tests:  I Ordered, and personally interpreted labs.  The pertinent results include:  ***   Imaging Studies ordered:  I ordered imaging studies including ***  I independently visualized and interpreted imaging which showed *** I agree with the radiologist interpretation   Cardiac Monitoring:  The patient was maintained on a cardiac monitor.  I personally viewed and interpreted the cardiac monitored which showed an underlying rhythm of: ***   Medicines ordered and prescription drug management:  I ordered medication including H2 blockers, steroids I have reviewed the patients home medicines and have made adjustments as needed  Critical Interventions:  ***   Reevaluation:  Presents with concerns of allergic reaction, she had benign physical exam, will start her on H2 blocker steroids and continue to monitor.    Consultations Obtained:  I requested consultation with the ***,  and discussed lab and imaging findings as well as pertinent plan - they recommend: ***    Test Considered:  ***    Rule out ****    Dispostion and problem list  After consideration of the  diagnostic results and the patients response to treatment, I feel that the patent would benefit from ***.       {Document critical care time when appropriate:1} {Document review of labs and clinical decision tools ie heart score, Chads2Vasc2 etc:1}  {Document your independent review of radiology images, and any outside records:1} {Document your discussion with family members, caretakers, and with consultants:1} {Document social determinants of health affecting pt's care:1} {Document your decision making why or why not admission, treatments were needed:1} Final Clinical Impression(s) / ED Diagnoses Final diagnoses:  None    Rx / DC Orders ED Discharge Orders     None

## 2022-10-30 NOTE — ED Triage Notes (Signed)
Pt reported to ED with c/o swelling to bilateral hands and itching in throat that began while sleeping this evening. Pt denies any known allergens but states she ate shrimp and broccoli for dinner this evening.

## 2022-10-31 NOTE — Discharge Instructions (Signed)
Unclear what provoked your reaction, I would  like you to continue taking Claritin as well as Pepcid once daily for next 7 days, you may also pick up hydrocortisone cream over-the-counter and apply to areas that remain itchy.  Recommend cool showers/baths this will help with any itchiness.  I would avoid shrimp and broccoli at this time  Please follow-up with an allergist you may get a referral from your primary care doctor  Please come back if you develop any tongue throat lip swelling difficulty breathing, systemic rash she will need further evaluation
